# Patient Record
Sex: Male | Born: 1960 | Race: White | Hispanic: No | Marital: Married | Smoking: Never smoker
Health system: Southern US, Community
[De-identification: ages and names within clinical notes are randomized; demographics above are authoritative.]

## PROBLEM LIST (undated history)

## (undated) DIAGNOSIS — I82409 Acute embolism and thrombosis of unspecified deep veins of unspecified lower extremity: Secondary | ICD-10-CM

## (undated) DIAGNOSIS — E119 Type 2 diabetes mellitus without complications: Secondary | ICD-10-CM

## (undated) DIAGNOSIS — R002 Palpitations: Secondary | ICD-10-CM

## (undated) DIAGNOSIS — J45909 Unspecified asthma, uncomplicated: Secondary | ICD-10-CM

## (undated) DIAGNOSIS — G43909 Migraine, unspecified, not intractable, without status migrainosus: Secondary | ICD-10-CM

## (undated) DIAGNOSIS — E7800 Pure hypercholesterolemia, unspecified: Secondary | ICD-10-CM

## (undated) DIAGNOSIS — I639 Cerebral infarction, unspecified: Secondary | ICD-10-CM

## (undated) DIAGNOSIS — I491 Atrial premature depolarization: Secondary | ICD-10-CM

## (undated) DIAGNOSIS — I152 Hypertension secondary to endocrine disorders: Secondary | ICD-10-CM

## (undated) DIAGNOSIS — I428 Other cardiomyopathies: Secondary | ICD-10-CM

## (undated) DIAGNOSIS — N200 Calculus of kidney: Secondary | ICD-10-CM

## (undated) DIAGNOSIS — I6529 Occlusion and stenosis of unspecified carotid artery: Secondary | ICD-10-CM

## (undated) DIAGNOSIS — E872 Acidosis: Secondary | ICD-10-CM

## (undated) DIAGNOSIS — K529 Noninfective gastroenteritis and colitis, unspecified: Secondary | ICD-10-CM

## (undated) DIAGNOSIS — I480 Paroxysmal atrial fibrillation: Secondary | ICD-10-CM

## (undated) DIAGNOSIS — R197 Diarrhea, unspecified: Secondary | ICD-10-CM

## (undated) DIAGNOSIS — E1159 Type 2 diabetes mellitus with other circulatory complications: Secondary | ICD-10-CM

## (undated) DIAGNOSIS — A419 Sepsis, unspecified organism: Secondary | ICD-10-CM

## (undated) DIAGNOSIS — R6520 Severe sepsis without septic shock: Secondary | ICD-10-CM

## (undated) HISTORY — DX: Diarrhea, unspecified: R19.7

## (undated) HISTORY — DX: Cerebral infarction, unspecified: I63.9

## (undated) HISTORY — DX: Acidosis: E87.2

## (undated) HISTORY — DX: Migraine, unspecified, not intractable, without status migrainosus: G43.909

## (undated) HISTORY — DX: Acute embolism and thrombosis of unspecified deep veins of unspecified lower extremity: I82.409

## (undated) HISTORY — DX: Sepsis, unspecified organism: A41.9

## (undated) HISTORY — DX: Type 2 diabetes mellitus without complications: E11.9

## (undated) HISTORY — DX: Atrial premature depolarization: I49.1

## (undated) HISTORY — PX: CHOLECYSTECTOMY: SHX55

## (undated) HISTORY — DX: Palpitations: R00.2

## (undated) HISTORY — DX: Severe sepsis without septic shock: R65.20

## (undated) HISTORY — DX: Unspecified asthma, uncomplicated: J45.909

## (undated) HISTORY — DX: Noninfective gastroenteritis and colitis, unspecified: K52.9

## (undated) HISTORY — DX: Occlusion and stenosis of unspecified carotid artery: I65.29

## (undated) HISTORY — PX: COLON RESECTION: SHX5231

## (undated) HISTORY — DX: Calculus of kidney: N20.0

## (undated) HISTORY — DX: Pure hypercholesterolemia, unspecified: E78.00

---
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
## 2009-08-15 HISTORY — PX: IVC FILTER INSERTION: CATH118245

## 2014-03-25 DIAGNOSIS — I639 Cerebral infarction, unspecified: Secondary | ICD-10-CM

## 2014-03-25 DIAGNOSIS — G43909 Migraine, unspecified, not intractable, without status migrainosus: Secondary | ICD-10-CM

## 2014-03-25 HISTORY — DX: Cerebral infarction, unspecified: I63.9

## 2014-03-25 HISTORY — DX: Migraine, unspecified, not intractable, without status migrainosus: G43.909

## 2015-02-12 DIAGNOSIS — E872 Acidosis: Secondary | ICD-10-CM | POA: Insufficient documentation

## 2015-02-12 DIAGNOSIS — E119 Type 2 diabetes mellitus without complications: Secondary | ICD-10-CM

## 2015-02-12 DIAGNOSIS — A419 Sepsis, unspecified organism: Secondary | ICD-10-CM

## 2015-02-12 DIAGNOSIS — K529 Noninfective gastroenteritis and colitis, unspecified: Secondary | ICD-10-CM

## 2015-02-12 DIAGNOSIS — E8729 Other acidosis: Secondary | ICD-10-CM

## 2015-02-12 DIAGNOSIS — R6520 Severe sepsis without septic shock: Secondary | ICD-10-CM

## 2015-02-12 HISTORY — DX: Other acidosis: E87.29

## 2015-02-12 HISTORY — DX: Type 2 diabetes mellitus without complications: E11.9

## 2015-02-12 HISTORY — DX: Noninfective gastroenteritis and colitis, unspecified: K52.9

## 2015-02-12 HISTORY — DX: Sepsis, unspecified organism: R65.20

## 2015-02-12 HISTORY — DX: Sepsis, unspecified organism: A41.9

## 2015-02-12 HISTORY — DX: Acidosis: E87.2

## 2015-10-08 DIAGNOSIS — K529 Noninfective gastroenteritis and colitis, unspecified: Secondary | ICD-10-CM

## 2015-10-08 DIAGNOSIS — I6529 Occlusion and stenosis of unspecified carotid artery: Secondary | ICD-10-CM

## 2015-10-08 DIAGNOSIS — R197 Diarrhea, unspecified: Secondary | ICD-10-CM

## 2015-10-08 HISTORY — DX: Diarrhea, unspecified: R19.7

## 2015-10-08 HISTORY — DX: Noninfective gastroenteritis and colitis, unspecified: K52.9

## 2015-10-08 HISTORY — DX: Occlusion and stenosis of unspecified carotid artery: I65.29

## 2015-12-11 DIAGNOSIS — E538 Deficiency of other specified B group vitamins: Secondary | ICD-10-CM | POA: Diagnosis not present

## 2015-12-11 DIAGNOSIS — E785 Hyperlipidemia, unspecified: Secondary | ICD-10-CM | POA: Diagnosis not present

## 2015-12-11 DIAGNOSIS — E119 Type 2 diabetes mellitus without complications: Secondary | ICD-10-CM | POA: Diagnosis not present

## 2015-12-11 DIAGNOSIS — E559 Vitamin D deficiency, unspecified: Secondary | ICD-10-CM | POA: Diagnosis not present

## 2015-12-11 DIAGNOSIS — Z6823 Body mass index (BMI) 23.0-23.9, adult: Secondary | ICD-10-CM | POA: Diagnosis not present

## 2015-12-11 DIAGNOSIS — M25519 Pain in unspecified shoulder: Secondary | ICD-10-CM | POA: Diagnosis not present

## 2016-03-11 DIAGNOSIS — E785 Hyperlipidemia, unspecified: Secondary | ICD-10-CM | POA: Diagnosis not present

## 2016-03-11 DIAGNOSIS — E119 Type 2 diabetes mellitus without complications: Secondary | ICD-10-CM | POA: Diagnosis not present

## 2016-03-11 DIAGNOSIS — E538 Deficiency of other specified B group vitamins: Secondary | ICD-10-CM | POA: Diagnosis not present

## 2016-03-11 DIAGNOSIS — E559 Vitamin D deficiency, unspecified: Secondary | ICD-10-CM | POA: Diagnosis not present

## 2016-04-20 DIAGNOSIS — J441 Chronic obstructive pulmonary disease with (acute) exacerbation: Secondary | ICD-10-CM | POA: Diagnosis not present

## 2016-06-13 DIAGNOSIS — M25519 Pain in unspecified shoulder: Secondary | ICD-10-CM | POA: Diagnosis not present

## 2016-06-13 DIAGNOSIS — E663 Overweight: Secondary | ICD-10-CM | POA: Diagnosis not present

## 2016-06-13 DIAGNOSIS — Z6823 Body mass index (BMI) 23.0-23.9, adult: Secondary | ICD-10-CM | POA: Diagnosis not present

## 2016-06-13 DIAGNOSIS — E538 Deficiency of other specified B group vitamins: Secondary | ICD-10-CM | POA: Diagnosis not present

## 2016-06-13 DIAGNOSIS — E119 Type 2 diabetes mellitus without complications: Secondary | ICD-10-CM | POA: Diagnosis not present

## 2016-06-13 DIAGNOSIS — E559 Vitamin D deficiency, unspecified: Secondary | ICD-10-CM | POA: Diagnosis not present

## 2016-06-13 DIAGNOSIS — Z125 Encounter for screening for malignant neoplasm of prostate: Secondary | ICD-10-CM | POA: Diagnosis not present

## 2016-06-13 DIAGNOSIS — E785 Hyperlipidemia, unspecified: Secondary | ICD-10-CM | POA: Diagnosis not present

## 2016-06-13 DIAGNOSIS — Z0000 Encounter for general adult medical examination without abnormal findings: Secondary | ICD-10-CM | POA: Diagnosis not present

## 2016-09-14 DIAGNOSIS — Z6823 Body mass index (BMI) 23.0-23.9, adult: Secondary | ICD-10-CM | POA: Diagnosis not present

## 2016-09-14 DIAGNOSIS — E119 Type 2 diabetes mellitus without complications: Secondary | ICD-10-CM | POA: Diagnosis not present

## 2016-09-14 DIAGNOSIS — E559 Vitamin D deficiency, unspecified: Secondary | ICD-10-CM | POA: Diagnosis not present

## 2016-09-14 DIAGNOSIS — E785 Hyperlipidemia, unspecified: Secondary | ICD-10-CM | POA: Diagnosis not present

## 2016-11-15 DIAGNOSIS — H52223 Regular astigmatism, bilateral: Secondary | ICD-10-CM | POA: Diagnosis not present

## 2016-11-15 DIAGNOSIS — H524 Presbyopia: Secondary | ICD-10-CM | POA: Diagnosis not present

## 2016-12-13 DIAGNOSIS — E119 Type 2 diabetes mellitus without complications: Secondary | ICD-10-CM | POA: Diagnosis not present

## 2016-12-13 DIAGNOSIS — G459 Transient cerebral ischemic attack, unspecified: Secondary | ICD-10-CM | POA: Diagnosis not present

## 2016-12-13 DIAGNOSIS — Z23 Encounter for immunization: Secondary | ICD-10-CM | POA: Diagnosis not present

## 2016-12-13 DIAGNOSIS — E785 Hyperlipidemia, unspecified: Secondary | ICD-10-CM | POA: Diagnosis not present

## 2016-12-13 DIAGNOSIS — M25519 Pain in unspecified shoulder: Secondary | ICD-10-CM | POA: Diagnosis not present

## 2017-03-15 DIAGNOSIS — E785 Hyperlipidemia, unspecified: Secondary | ICD-10-CM | POA: Diagnosis not present

## 2017-03-15 DIAGNOSIS — E1140 Type 2 diabetes mellitus with diabetic neuropathy, unspecified: Secondary | ICD-10-CM | POA: Diagnosis not present

## 2017-03-15 DIAGNOSIS — K529 Noninfective gastroenteritis and colitis, unspecified: Secondary | ICD-10-CM | POA: Diagnosis not present

## 2017-03-15 DIAGNOSIS — E119 Type 2 diabetes mellitus without complications: Secondary | ICD-10-CM | POA: Diagnosis not present

## 2017-03-15 DIAGNOSIS — E559 Vitamin D deficiency, unspecified: Secondary | ICD-10-CM | POA: Diagnosis not present

## 2017-03-15 DIAGNOSIS — M25519 Pain in unspecified shoulder: Secondary | ICD-10-CM | POA: Diagnosis not present

## 2017-06-16 DIAGNOSIS — M25519 Pain in unspecified shoulder: Secondary | ICD-10-CM | POA: Diagnosis not present

## 2017-06-16 DIAGNOSIS — E538 Deficiency of other specified B group vitamins: Secondary | ICD-10-CM | POA: Diagnosis not present

## 2017-06-16 DIAGNOSIS — Z23 Encounter for immunization: Secondary | ICD-10-CM | POA: Diagnosis not present

## 2017-06-16 DIAGNOSIS — Z0000 Encounter for general adult medical examination without abnormal findings: Secondary | ICD-10-CM | POA: Diagnosis not present

## 2017-06-16 DIAGNOSIS — Z6822 Body mass index (BMI) 22.0-22.9, adult: Secondary | ICD-10-CM | POA: Diagnosis not present

## 2017-06-18 DIAGNOSIS — R200 Anesthesia of skin: Secondary | ICD-10-CM | POA: Diagnosis not present

## 2017-06-18 DIAGNOSIS — E119 Type 2 diabetes mellitus without complications: Secondary | ICD-10-CM | POA: Diagnosis not present

## 2017-06-18 DIAGNOSIS — I10 Essential (primary) hypertension: Secondary | ICD-10-CM | POA: Diagnosis not present

## 2017-06-18 DIAGNOSIS — R079 Chest pain, unspecified: Secondary | ICD-10-CM | POA: Diagnosis not present

## 2017-06-18 DIAGNOSIS — Z79899 Other long term (current) drug therapy: Secondary | ICD-10-CM | POA: Diagnosis not present

## 2017-06-18 DIAGNOSIS — Z8249 Family history of ischemic heart disease and other diseases of the circulatory system: Secondary | ICD-10-CM | POA: Diagnosis not present

## 2017-06-18 DIAGNOSIS — Z7982 Long term (current) use of aspirin: Secondary | ICD-10-CM | POA: Diagnosis not present

## 2017-06-18 DIAGNOSIS — R0789 Other chest pain: Secondary | ICD-10-CM | POA: Diagnosis not present

## 2017-06-18 DIAGNOSIS — E7800 Pure hypercholesterolemia, unspecified: Secondary | ICD-10-CM | POA: Diagnosis not present

## 2017-06-18 DIAGNOSIS — J45909 Unspecified asthma, uncomplicated: Secondary | ICD-10-CM | POA: Diagnosis not present

## 2017-06-18 DIAGNOSIS — Z8673 Personal history of transient ischemic attack (TIA), and cerebral infarction without residual deficits: Secondary | ICD-10-CM | POA: Diagnosis not present

## 2017-06-18 DIAGNOSIS — Z794 Long term (current) use of insulin: Secondary | ICD-10-CM | POA: Diagnosis not present

## 2017-06-19 DIAGNOSIS — R079 Chest pain, unspecified: Secondary | ICD-10-CM | POA: Diagnosis not present

## 2017-06-19 DIAGNOSIS — E119 Type 2 diabetes mellitus without complications: Secondary | ICD-10-CM | POA: Diagnosis not present

## 2017-06-19 DIAGNOSIS — I10 Essential (primary) hypertension: Secondary | ICD-10-CM | POA: Diagnosis not present

## 2017-06-20 DIAGNOSIS — E7800 Pure hypercholesterolemia, unspecified: Secondary | ICD-10-CM | POA: Insufficient documentation

## 2017-06-20 DIAGNOSIS — N200 Calculus of kidney: Secondary | ICD-10-CM

## 2017-06-20 DIAGNOSIS — J45909 Unspecified asthma, uncomplicated: Secondary | ICD-10-CM

## 2017-06-20 HISTORY — DX: Calculus of kidney: N20.0

## 2017-06-20 HISTORY — DX: Unspecified asthma, uncomplicated: J45.909

## 2017-06-20 HISTORY — DX: Pure hypercholesterolemia, unspecified: E78.00

## 2017-06-21 ENCOUNTER — Other Ambulatory Visit: Payer: Self-pay | Admitting: *Deleted

## 2017-06-21 DIAGNOSIS — Z6822 Body mass index (BMI) 22.0-22.9, adult: Secondary | ICD-10-CM | POA: Diagnosis not present

## 2017-06-21 DIAGNOSIS — R079 Chest pain, unspecified: Secondary | ICD-10-CM | POA: Insufficient documentation

## 2017-06-21 DIAGNOSIS — Z79899 Other long term (current) drug therapy: Secondary | ICD-10-CM | POA: Diagnosis not present

## 2017-06-21 DIAGNOSIS — R9439 Abnormal result of other cardiovascular function study: Secondary | ICD-10-CM | POA: Diagnosis not present

## 2017-06-21 NOTE — Progress Notes (Signed)
Cardiology Office Note:    Date:  06/22/2017   ID:  Patrick Curry, DOB 1961/04/06, MRN [id number]  PCP:  Guadalupe Maple., MD  Cardiologist:  Norman Herrlich, MD   Referring MD: Guadalupe Maple., MD  ASSESSMENT:    1. Chest pain in adult   2. Abnormal myocardial perfusion study   3. Hypercholesterolemia   4. CAD in native artery    PLAN:    In order of problems listed above:  1. His chest pain symptoms are atypical but anginal in nature relieved with nitroglycerin.  His myocardial perfusion study is abnormal consistent with previous infarction done pharmacologically showed no ischemia.  He has had no recurrent chest pain I asked him to initiate his beta-blocker continue aspirin statin and nitroglycerin as needed. 2. His myocardial perfusion study is abnormal.  He has objective evidence of CAD I am concerned that he has balanced ischemia and I asked him to undergo cardiac CTA. 3. Stable continue statin 4. See discussion on normal number #1 and #2 we will continue treatment with aspirin taking a higher dose at the direction of her neurologist along with a beta-blocker and statin.  If he has evidence of high risk markers and cardiac CTA will need to consider revascularization in a diabetic.  Next appointment   Medication Adjustments/Labs and Tests Ordered: Current medicines are reviewed at length with the patient today.  Concerns regarding medicines are outlined above.  Orders Placed This Encounter  Procedures  . CT CORONARY MORPH W/CTA COR W/SCORE W/CA W/CM &/OR WO/CM  . CT CORONARY FRACTIONAL FLOW RESERVE DATA PREP  . CT CORONARY FRACTIONAL FLOW RESERVE FLUID ANALYSIS   Meds ordered this encounter  Medications  . carvedilol (COREG) 6.25 MG tablet    Sig: Take 1 tablet (6.25 mg total) 2 (two) times daily with a meal by mouth.    Dispense:  60 tablet    Refill:  3     Chief Complaint  Patient presents with  . Coronary Artery Disease  4 weeks  History of Present Illness:    Patrick Curry is a 56 y.o. male  DM hyperlipidemia and stroke with a recent [facility] ED visit and brief admission with chest pain who is being seen today for the evaluation of chest pain and an abnormal MPI at the request of Guadalupe Maple., MD. He was recently admitted to [facility] with chest pain.  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  He was given 3 nitroglycerin with relief.  His chest pain is unusual he describes it as pins and needles throughout the left precordium no radiation no shortness of breath but he felt very weak and lightheaded and unfortunately did not check to see if he was hypoglycemic.  He has had no recurrent chest pain but the day of his myocardial perfusion study was nauseous and unable to use a treadmill was done pharmacologically.  He was prescribed a beta-blocker that he has yet to initiate.  He has a history of stroke as well as colitis that could be ischemic in nature.  Past Medical History:  Diagnosis Date  . Asthma 06/20/2017  . Carotid stenosis 10/08/2015  . Diarrhea 10/08/2015  . DVT (deep venous thrombosis) (HCC)   . Hemorrhagic colitis 02/12/2015  . Hypercholesterolemia 06/20/2017  . IBD (inflammatory bowel disease) 10/08/2015  . Ketoacidosis 02/12/2015  .  Kidney stones 06/20/2017  . Migraine 03/25/2014  . Severe sepsis (HCC) 02/12/2015  . Stroke (HCC) 03/25/2014  . Type 2 diabetes mellitus without complication (HCC) 02/12/2015    Past Surgical History:  Procedure Laterality Date  . CHOLECYSTECTOMY    . COLON RESECTION     Partial, after trauma  . IVC FILTER INSERTION  2011    Current Medications: Current Meds  Medication Sig  . amitriptyline (ELAVIL) 25 MG tablet Take 25 mg at bedtime by mouth.  Marland Kitchen. aspirin 325 MG tablet Take 325 mg daily by mouth.  . carvedilol (COREG) 6.25 MG tablet Take 1 tablet (6.25 mg total) 2  (two) times daily with a meal by mouth.  . cyanocobalamin 1000 MCG tablet Take 1,000 mcg daily by mouth.  . folic acid (FOLVITE) 1 MG tablet Take 1 mg by mouth.  . gabapentin (NEURONTIN) 800 MG tablet TAKE 1 TABLET FOUR TIMES DAILY AS NEEDED.  Marland Kitchen. HYDROcodone-acetaminophen (NORCO) 10-325 MG tablet Take by mouth.  . insulin lispro (HUMALOG) 100 UNIT/ML injection Inject into the skin.  . Lactobacillus ([employer] PROBIOTIC ACIDOPHILUS) 10 MG CAPS Take by mouth.  Marland Kitchen. LEVEMIR FLEXTOUCH 100 UNIT/ML Pen INJECT 80 UNITS SUBCUTANEOUSLY DAILY  . loperamide (IMODIUM) 1 MG/5ML solution Take as needed by mouth for diarrhea or loose stools.  . metFORMIN (GLUCOPHAGE) 1000 MG tablet Take 1,000 mg by mouth.  . montelukast (SINGULAIR) 10 MG tablet   . nitroGLYCERIN (NITROSTAT) 0.4 MG SL tablet   . pravastatin (PRAVACHOL) 20 MG tablet Take 20 mg by mouth.  . sildenafil (REVATIO) 20 MG tablet TAKE 2-5 TABLETS BY MOUTH AS NEEDED.  Marland Kitchen. sitaGLIPtin (JANUVIA) 100 MG tablet Take 100 mg by mouth.  . sulfaSALAzine (AZULFIDINE) 500 MG tablet TAKE THREE TABLETS BY MOUTH TWICE DAILY  . SURE COMFORT PEN NEEDLES 31G X 8 MM MISC See admin instructions.  . Vitamin D, Ergocalciferol, (DRISDOL) 50000 units CAPS capsule TAKE 1 CAPSULE ONCE WEEKLY.  . [DISCONTINUED] carvedilol (COREG) 3.125 MG tablet      Allergies:   Patient has no known allergies.   Social History   Socioeconomic History  . Marital status: Married    Spouse name: None  . Number of children: None  . Years of education: None  . Highest education level: None  Social Needs  . Financial resource strain: None  . Food insecurity - worry: None  . Food insecurity - inability: None  . Transportation needs - medical: None  . Transportation needs - non-medical: None  Occupational History  . None  Tobacco Use  . Smoking status: Never Smoker  . Smokeless tobacco: Never Used  Substance and Sexual Activity  . Alcohol use: No    Frequency: Never  . Drug use: No  .  Sexual activity: None  Other Topics Concern  . None  Social History Narrative  . None     Family History: The patient's family history includes Diabetes in his father and mother; Heart disease in his mother; Hypertension in his mother.  ROS:   Review of Systems  Constitution: Negative.  HENT: Negative.   Eyes: Negative.   Cardiovascular: Positive for chest pain.  Respiratory: Negative.   Endocrine: Negative.   Hematologic/Lymphatic: Negative.   Skin: Negative.   Musculoskeletal: Negative.   Gastrointestinal: Negative.   Genitourinary: Negative.   Neurological: Positive for light-headedness.  Psychiatric/Behavioral: Negative.   Allergic/Immunologic: Negative.    Please see the history of present illness.      EKGs/Labs/Other Studies Reviewed:  The following studies were reviewed today: He has had no recurrent chest pain I did not repeat an EKG today [facility] records including ED notes admission discharge labs EKG and MPI reviewed prior to visit MPI 06/19/17 Fixed defect moderate mid septum with hypokinesia, EF 62% Recent Labs: CBC, CMP were nromal, D Dimer 203, serial Troponin < 0.01, BNP 62, EKG and CXR in ED normal   Physical Exam:    VS:  BP (!) 142/78 (BP Location: Left Arm, Patient Position: Sitting, Cuff Size: Normal)   Pulse 90   Ht 6' (1.829 m)   Wt 170 lb (77.1 kg)   SpO2 96%   BMI 23.06 kg/m     Wt Readings from Last 3 Encounters:  06/22/17 170 lb (77.1 kg)     GEN: He does not appear debilitated a week and has no obvious residual from his previous stroke treated with TPA well nourished, well developed in no acute distress HEENT: Normal NECK: No JVD; No carotid bruits LYMPHATICS: No lymphadenopathy CARDIAC: RRR, no murmurs, rubs, gallops RESPIRATORY:  Clear to auscultation without rales, wheezing or rhonchi  ABDOMEN: Soft, non-tender, non-distended MUSCULOSKELETAL:  No edema; No deformity  SKIN: Warm and dry NEUROLOGIC:  Alert and  oriented x 3 PSYCHIATRIC:  Normal affect     Signed, Norman Herrlich, MD  06/22/2017 4:58 PM    [facility]

## 2017-06-22 ENCOUNTER — Ambulatory Visit (INDEPENDENT_AMBULATORY_CARE_PROVIDER_SITE_OTHER): Payer: BLUE CROSS/BLUE SHIELD | Admitting: Cardiology

## 2017-06-22 ENCOUNTER — Encounter: Payer: Self-pay | Admitting: Cardiology

## 2017-06-22 VITALS — BP 142/78 | HR 90 | Ht 72.0 in | Wt 170.0 lb

## 2017-06-22 DIAGNOSIS — I2510 Atherosclerotic heart disease of native coronary artery without angina pectoris: Secondary | ICD-10-CM | POA: Diagnosis not present

## 2017-06-22 DIAGNOSIS — R079 Chest pain, unspecified: Secondary | ICD-10-CM | POA: Diagnosis not present

## 2017-06-22 DIAGNOSIS — R9439 Abnormal result of other cardiovascular function study: Secondary | ICD-10-CM | POA: Diagnosis not present

## 2017-06-22 DIAGNOSIS — E7800 Pure hypercholesterolemia, unspecified: Secondary | ICD-10-CM | POA: Diagnosis not present

## 2017-06-22 MED ORDER — CARVEDILOL 6.25 MG PO TABS: 6.2500 mg | ORAL_TABLET | Freq: Two times a day (BID) | ORAL | 3 refills | Status: DC

## 2017-06-22 NOTE — Patient Instructions (Addendum)
Medication Instructions:  Your physician has recommended you make the following change in your medication:  START carvedilol (Coreg) 6.25 mg twice daily  Labwork: None  Testing/Procedures: You will be scheduled for a cardiac CT.  Follow-Up: Your physician recommends that you schedule a follow-up appointment in: 4 weeks.  Any Other Special Instructions Will Be Listed Below (If Applicable).     If you need a refill on your cardiac medications before your next appointment, please call your pharmacy.  Please arrive at the [facility] main entrance of [facility] at xx:xx AM (30-45 minutes prior to test start time)  [facility] [address] [place], [place] [address] [phone]  Proceed to the [facility] Radiology Department (First Floor).  Please follow these instructions carefully (unless otherwise directed):  Hold all erectile dysfunction medications at least 48 hours prior to test.  On the Night Before the Test: . Drink plenty of water. . Do not consume any caffeinated/decaffeinated beverages or chocolate 12 hours prior to your test. . Do not take any antihistamines 12 hours prior to your test. . If you take Metformin do not take 24 hours prior to test.  On the Day of the Test: . Drink plenty of water. Do not drink any water within one hour of the test. . Do not eat any food 4 hours prior to the test. . You may take your regular medications prior to the test.  After the Test: . Drink plenty of water. . After receiving IV contrast, you may experience a mild flushed feeling. This is normal. . On occasion, you may experience a mild rash up to 24 hours after the test. This is not dangerous. If this occurs, you can take Benadryl 25 mg and increase your fluid intake. . If you experience trouble breathing, this can be serious. If it is severe call 911 IMMEDIATELY. If it is mild, please call our office. . If you take: Glipizide/Metformin, please do  not take 48 hours after completing test.    DASH Eating Plan DASH stands for "Dietary Approaches to Stop Hypertension." The DASH eating plan is a healthy eating plan that has been shown to reduce high blood pressure (hypertension). It may also reduce your risk for type 2 diabetes, heart disease, and stroke. The DASH eating plan may also help with weight loss. What are tips for following this plan? General guidelines  Avoid eating more than 2,300 mg (milligrams) of salt (sodium) a day. If you have hypertension, you may need to reduce your sodium intake to 1,500 mg a day.  Limit alcohol intake to no more than 1 drink a day for nonpregnant women and 2 drinks a day for men. One drink equals 12 oz of beer, 5 oz of wine, or 1 oz of hard liquor.  Work with your health care provider to maintain a healthy body weight or to lose weight. Ask what an ideal weight is for you.  Get at least 30 minutes of exercise that causes your heart to beat faster (aerobic exercise) most days of the week. Activities may include walking, swimming, or biking.  Work with your health care provider or diet and nutrition specialist (dietitian) to adjust your eating plan to your individual calorie needs. Reading food labels  Check food labels for the amount of sodium per serving. Choose foods with less than 5 percent of the Daily Value of sodium. Generally, foods with less than 300 mg of sodium per serving fit into this eating  plan.  To find whole grains, look for the word "whole" as the first word in the ingredient list. Shopping  Buy products labeled as "low-sodium" or "no salt added."  Buy fresh foods. Avoid canned foods and premade or frozen meals. Cooking  Avoid adding salt when cooking. Use salt-free seasonings or herbs instead of table salt or sea salt. Check with your health care provider or pharmacist before using salt substitutes.  Do not fry foods. Cook foods using healthy methods such as baking, boiling,  grilling, and broiling instead.  Cook with heart-healthy oils, such as olive, canola, soybean, or sunflower oil. Meal planning   Eat a balanced diet that includes: ? 5 or more servings of fruits and vegetables each day. At each meal, try to fill half of your plate with fruits and vegetables. ? Up to 6-8 servings of whole grains each day. ? Less than 6 oz of lean meat, poultry, or fish each day. A 3-oz serving of meat is about the same size as a deck of cards. One egg equals 1 oz. ? 2 servings of low-fat dairy each day. ? A serving of nuts, seeds, or beans 5 times each week. ? Heart-healthy fats. Healthy fats called Omega-3 fatty acids are found in foods such as flaxseeds and coldwater fish, like sardines, salmon, and mackerel.  Limit how much you eat of the following: ? Canned or prepackaged foods. ? Food that is high in trans fat, such as fried foods. ? Food that is high in saturated fat, such as fatty meat. ? Sweets, desserts, sugary drinks, and other foods with added sugar. ? Full-fat dairy products.  Do not salt foods before eating.  Try to eat at least 2 vegetarian meals each week.  Eat more home-cooked food and less restaurant, buffet, and fast food.  When eating at a restaurant, ask that your food be prepared with less salt or no salt, if possible. What foods are recommended? The items listed may not be a complete list. Talk with your dietitian about what dietary choices are best for you. Grains Whole-grain or whole-wheat bread. Whole-grain or whole-wheat pasta. Brown rice. Orpah Cobbatmeal. Quinoa. Bulgur. Whole-grain and low-sodium cereals. Pita bread. Low-fat, low-sodium crackers. Whole-wheat flour tortillas. Vegetables Fresh or frozen vegetables (raw, steamed, roasted, or grilled). Low-sodium or reduced-sodium tomato and vegetable juice. Low-sodium or reduced-sodium tomato sauce and tomato paste. Low-sodium or reduced-sodium canned vegetables. Fruits All fresh, dried, or frozen  fruit. Canned fruit in natural juice (without added sugar). Meat and other protein foods Skinless chicken or [place]. Ground chicken or [place]. Pork with fat trimmed off. Fish and seafood. Egg whites. Dried beans, peas, or lentils. Unsalted nuts, nut butters, and seeds. Unsalted canned beans. Lean cuts of beef with fat trimmed off. Low-sodium, lean deli meat. Dairy Low-fat (1%) or fat-free (skim) milk. Fat-free, low-fat, or reduced-fat cheeses. Nonfat, low-sodium ricotta or cottage cheese. Low-fat or nonfat yogurt. Low-fat, low-sodium cheese. Fats and oils Soft margarine without trans fats. Vegetable oil. Low-fat, reduced-fat, or light mayonnaise and salad dressings (reduced-sodium). Canola, safflower, olive, soybean, and sunflower oils. Avocado. Seasoning and other foods Herbs. Spices. Seasoning mixes without salt. Unsalted popcorn and pretzels. Fat-free sweets. What foods are not recommended? The items listed may not be a complete list. Talk with your dietitian about what dietary choices are best for you. Grains Baked goods made with fat, such as croissants, muffins, or some breads. Dry pasta or rice meal packs. Vegetables Creamed or fried vegetables. Vegetables in a cheese sauce. Regular  canned vegetables (not low-sodium or reduced-sodium). Regular canned tomato sauce and paste (not low-sodium or reduced-sodium). Regular tomato and vegetable juice (not low-sodium or reduced-sodium). Rosita Fire. Olives. Fruits Canned fruit in a light or heavy syrup. Fried fruit. Fruit in cream or butter sauce. Meat and other protein foods Fatty cuts of meat. Ribs. Fried meat. Tomasa Blase. Sausage. Bologna and other processed lunch meats. Salami. Fatback. Hotdogs. Bratwurst. Salted nuts and seeds. Canned beans with added salt. Canned or smoked fish. Whole eggs or egg yolks. Chicken or [place] with skin. Dairy Whole or 2% milk, cream, and half-and-half. Whole or full-fat cream cheese. Whole-fat or sweetened yogurt. Full-fat  cheese. Nondairy creamers. Whipped toppings. Processed cheese and cheese spreads. Fats and oils Butter. Stick margarine. Lard. Shortening. Ghee. Bacon fat. Tropical oils, such as coconut, palm kernel, or palm oil. Seasoning and other foods Salted popcorn and pretzels. Onion salt, garlic salt, seasoned salt, table salt, and sea salt. Worcestershire sauce. Tartar sauce. Barbecue sauce. Teriyaki sauce. Soy sauce, including reduced-sodium. Steak sauce. Canned and packaged gravies. Fish sauce. Oyster sauce. Cocktail sauce. Horseradish that you find on the shelf. Ketchup. Mustard. Meat flavorings and tenderizers. Bouillon cubes. Hot sauce and Tabasco sauce. Premade or packaged marinades. Premade or packaged taco seasonings. Relishes. Regular salad dressings. Where to find more information:  National Heart, Lung, and Blood Institute: PopSteam.is  American Heart Association: www.heart.org Summary  The DASH eating plan is a healthy eating plan that has been shown to reduce high blood pressure (hypertension). It may also reduce your risk for type 2 diabetes, heart disease, and stroke.  With the DASH eating plan, you should limit salt (sodium) intake to 2,300 mg a day. If you have hypertension, you may need to reduce your sodium intake to 1,500 mg a day.  When on the DASH eating plan, aim to eat more fresh fruits and vegetables, whole grains, lean proteins, low-fat dairy, and heart-healthy fats.  Work with your health care provider or diet and nutrition specialist (dietitian) to adjust your eating plan to your individual calorie needs. This information is not intended to replace advice given to you by your health care provider. Make sure you discuss any questions you have with your health care provider. Document Released: 07/21/2011 Document Revised: 07/25/2016 Document Reviewed: 07/25/2016 Elsevier Interactive Patient Education  2017 [employer].

## 2017-06-30 DIAGNOSIS — E119 Type 2 diabetes mellitus without complications: Secondary | ICD-10-CM | POA: Diagnosis not present

## 2017-06-30 DIAGNOSIS — I10 Essential (primary) hypertension: Secondary | ICD-10-CM | POA: Diagnosis not present

## 2017-06-30 DIAGNOSIS — R079 Chest pain, unspecified: Secondary | ICD-10-CM | POA: Diagnosis not present

## 2017-07-06 DIAGNOSIS — R51 Headache: Secondary | ICD-10-CM | POA: Diagnosis not present

## 2017-07-06 DIAGNOSIS — R42 Dizziness and giddiness: Secondary | ICD-10-CM | POA: Diagnosis not present

## 2017-07-11 ENCOUNTER — Telehealth: Payer: Self-pay | Admitting: Cardiology

## 2017-07-11 DIAGNOSIS — Z01810 Encounter for preprocedural cardiovascular examination: Secondary | ICD-10-CM

## 2017-07-11 NOTE — Telephone Encounter (Signed)
Returned call to patient. Advised patient that he did not need an appointment for lab work prior to cardiac CT. Patient may arrive anytime between 8-5 and we close from 12-1 for lunch. Patient verbalized understanding. Reinforced that metformin needs to be held 24 hours prior to exam and 48 hours after exam. Patient verbalized understanding. No further questions.

## 2017-07-11 NOTE — Telephone Encounter (Signed)
Please call patient regarding his CT scan on 07/19/2017 and he has questions regarding the meds and directions prior to appt. Please call patient .

## 2017-07-13 DIAGNOSIS — Z01810 Encounter for preprocedural cardiovascular examination: Secondary | ICD-10-CM | POA: Diagnosis not present

## 2017-07-14 LAB — BASIC METABOLIC PANEL
BUN/Creatinine Ratio: 22 — ABNORMAL HIGH (ref 9–20)
BUN: 20 mg/dL (ref 6–24)
CALCIUM: 9 mg/dL (ref 8.7–10.2)
CO2: 26 mmol/L (ref 20–29)
CREATININE: 0.91 mg/dL (ref 0.76–1.27)
Chloride: 102 mmol/L (ref 96–106)
GFR calc Af Amer: 110 mL/min/{1.73_m2} (ref >59)
GFR calc non Af Amer: 95 mL/min/{1.73_m2} (ref >59)
GLUCOSE: 250 mg/dL — AB (ref 65–99)
Potassium: 4.1 mmol/L (ref 3.5–5.2)
Sodium: 143 mmol/L (ref 134–144)

## 2017-07-18 NOTE — Progress Notes (Signed)
Cardiology Office Note:    Date:  07/20/2017   ID:  Patrick Coyeralph Heckler, DOB 1960/09/22, MRN [id number]  PCP:  Guadalupe MapleGage, John F., MD  Cardiologist:  Norman HerrlichBrian Munley, MD    Referring MD: Guadalupe MapleGage, John F., MD    ASSESSMENT:    1. Mild CAD    PLAN:    In order of problems listed above:  1. His CTA showed very mild nonobstructive CAD has had no recurrent chest pain.  I asked him to continue medical treatment aspirin dose per neurology beta-blocker and statin and nitroglycerin as needed.  I will leave follow-up as needed as [place] will be [place] [place] in [place] but certainly would like to see him in the next year or sooner if he is having frequent angina.   Next appointment: as needed   Medication Adjustments/Labs and Tests Ordered: Current medicines are reviewed at length with the patient today.  Concerns regarding medicines are outlined above.  No orders of the defined types were placed in this encounter.  No orders of the defined types were placed in this encounter.   Chief Complaint  Patient presents with  . Follow-up  . Chest Pain    History of Present Illness:    Patrick Curry is a 56 y.o. male with a hx of DM hyperlipidemia and stroke with [facility] with chest pain  who is being seen today for the evaluation of chest pain and an abnormal MPI  last seen 1 month ago with a cardiac CTA.Marland Kitchen. Compliance with diet, lifestyle and medications: Yes He has had no further chest pain shortness of breath palpitation or syncope Past Medical History:  Diagnosis Date  . Asthma 06/20/2017  . Carotid stenosis 10/08/2015  . Diarrhea 10/08/2015  . DVT (deep venous thrombosis) (HCC)   . Hemorrhagic colitis 02/12/2015  . Hypercholesterolemia 06/20/2017  . IBD (inflammatory bowel disease) 10/08/2015  . Ketoacidosis 02/12/2015  . Kidney stones 06/20/2017  . Migraine 03/25/2014  . Severe sepsis (HCC) 02/12/2015  . Stroke (HCC) 03/25/2014  . Type 2 diabetes mellitus  without complication (HCC) 02/12/2015    Past Surgical History:  Procedure Laterality Date  . CHOLECYSTECTOMY    . COLON RESECTION     Partial, after trauma  . IVC FILTER INSERTION  2011    Current Medications: Current Meds  Medication Sig  . amitriptyline (ELAVIL) 25 MG tablet Take 25 mg by mouth at bedtime as needed.   Marland Kitchen. aspirin 325 MG tablet Take 325 mg daily by mouth.  . carvedilol (COREG) 6.25 MG tablet Take 1 tablet (6.25 mg total) 2 (two) times daily with a meal by mouth.  . cyanocobalamin 1000 MCG tablet Take 1,000 mcg daily by mouth.  . folic acid (FOLVITE) 1 MG tablet Take 1 mg by mouth.  . gabapentin (NEURONTIN) 800 MG tablet TAKE 1 TABLET FOUR TIMES DAILY AS NEEDED.  Marland Kitchen. HYDROcodone-acetaminophen (NORCO) 10-325 MG tablet Take by mouth.  . Lactobacillus ([employer] PROBIOTIC ACIDOPHILUS) 10 MG CAPS Take by mouth.  Marland Kitchen. LEVEMIR FLEXTOUCH 100 UNIT/ML Pen INJECT 80 UNITS SUBCUTANEOUSLY DAILY  . metFORMIN (GLUCOPHAGE) 1000 MG tablet Take 1,000 mg by mouth.  . montelukast (SINGULAIR) 10 MG tablet   . nitroGLYCERIN (NITROSTAT) 0.4 MG SL tablet   . pravastatin (PRAVACHOL) 20 MG tablet Take 20 mg by mouth daily.   . sildenafil (REVATIO) 20 MG tablet TAKE 2-5 TABLETS BY MOUTH AS NEEDED.  Marland Kitchen. sitaGLIPtin (JANUVIA) 100 MG tablet Take 100 mg  by mouth.  . sulfaSALAzine (AZULFIDINE) 500 MG tablet TAKE THREE TABLETS BY MOUTH TWICE DAILY  . SURE COMFORT PEN NEEDLES 31G X 8 MM MISC See admin instructions.  . Vitamin D, Ergocalciferol, (DRISDOL) 50000 units CAPS capsule TAKE 1 CAPSULE ONCE WEEKLY.  . [DISCONTINUED] loperamide (IMODIUM) 1 MG/5ML solution Take as needed by mouth for diarrhea or loose stools.     Allergies:   Patient has no known allergies.   Social History   Socioeconomic History  . Marital status: Married    Spouse name: None  . Number of children: None  . Years of education: None  . Highest education level: None  Social Needs  . Financial resource strain: None  . Food  insecurity - worry: None  . Food insecurity - inability: None  . Transportation needs - medical: None  . Transportation needs - non-medical: None  Occupational History  . None  Tobacco Use  . Smoking status: Never Smoker  . Smokeless tobacco: Never Used  Substance and Sexual Activity  . Alcohol use: No    Frequency: Never  . Drug use: No  . Sexual activity: None  Other Topics Concern  . None  Social History Narrative  . None     Family History: The patient's family history includes Diabetes in his father and mother; Heart disease in his mother; Hypertension in his mother. ROS:   Please see the history of present illness.    All other systems reviewed and are negative.  EKGs/Labs/Other Studies Reviewed:    The following studies were reviewed today:  Cardiac CTA: IMPRESSION: 1. Coronary artery calcium score of 24 Agatston units, placing the patient in the 64th percentile for age and gender, this suggests intermediate risk for future coronary events. 2.  Mild nonobstructive disease noted in the proximal LAD  Recent Labs: 07/13/2017: BUN 20; Creatinine, Ser 0.91; Potassium 4.1; Sodium 143  Recent Lipid Panel No results found for: CHOL, TRIG, HDL, CHOLHDL, VLDL, LDLCALC, LDLDIRECT  Physical Exam:    VS:  BP 110/70 (BP Location: Right Arm, Patient Position: Sitting, Cuff Size: Normal)   Pulse 73   Ht 6' (1.829 m)   Wt 177 lb (80.3 kg)   SpO2 97%   BMI 24.01 kg/m     Wt Readings from Last 3 Encounters:  07/20/17 177 lb (80.3 kg)  06/22/17 170 lb (77.1 kg)     GEN:  Well nourished, well developed in no acute distress HEENT: Normal NECK: No JVD; No carotid bruits LYMPHATICS: No lymphadenopathy CARDIAC: RRR, no murmurs, rubs, gallops RESPIRATORY:  Clear to auscultation without rales, wheezing or rhonchi  ABDOMEN: Soft, non-tender, non-distended MUSCULOSKELETAL:  No edema; No deformity  SKIN: Warm and dry NEUROLOGIC:  Alert and oriented x 3 PSYCHIATRIC:  Normal  affect    Signed, Norman Herrlich, MD  07/20/2017 10:19 AM    [facility]

## 2017-07-19 ENCOUNTER — Ambulatory Visit (HOSPITAL_COMMUNITY)
Admission: RE | Admit: 2017-07-19 | Discharge: 2017-07-19 | Disposition: A | Discharge status: Home / Self Care | Payer: BLUE CROSS/BLUE SHIELD | Source: Ambulatory Visit | Attending: Cardiology | Admitting: Cardiology

## 2017-07-19 ENCOUNTER — Ambulatory Visit (HOSPITAL_COMMUNITY): Admission: RE | Admit: 2017-07-19 | Payer: BLUE CROSS/BLUE SHIELD | Source: Ambulatory Visit

## 2017-07-19 ENCOUNTER — Encounter (HOSPITAL_COMMUNITY): Payer: Self-pay

## 2017-07-19 DIAGNOSIS — R079 Chest pain, unspecified: Secondary | ICD-10-CM | POA: Diagnosis not present

## 2017-07-19 DIAGNOSIS — I2510 Atherosclerotic heart disease of native coronary artery without angina pectoris: Secondary | ICD-10-CM | POA: Diagnosis not present

## 2017-07-19 DIAGNOSIS — R9439 Abnormal result of other cardiovascular function study: Secondary | ICD-10-CM | POA: Insufficient documentation

## 2017-07-19 MED ORDER — METOPROLOL TARTRATE 5 MG/5ML IV SOLN
5.0000 mg | Freq: Once | INTRAVENOUS | Status: AC
  Administered 2017-07-19: 5 mg via INTRAVENOUS

## 2017-07-19 MED ORDER — NITROGLYCERIN 0.4 MG SL SUBL
SUBLINGUAL_TABLET | SUBLINGUAL | Status: AC
  Filled 2017-07-19: qty 1

## 2017-07-19 MED ORDER — METOPROLOL TARTRATE 5 MG/5ML IV SOLN
INTRAVENOUS | Status: AC
  Filled 2017-07-19: qty 5

## 2017-07-19 MED ORDER — IOPAMIDOL (ISOVUE-370) INJECTION 76%
INTRAVENOUS | Status: AC
  Administered 2017-07-19: 80 mL
  Filled 2017-07-19: qty 100

## 2017-07-19 MED ORDER — NITROGLYCERIN 0.4 MG SL SUBL
0.4000 mg | SUBLINGUAL_TABLET | Freq: Once | SUBLINGUAL | Status: AC
  Administered 2017-07-19: 0.4 mg via SUBLINGUAL

## 2017-07-19 NOTE — Progress Notes (Signed)
CT scan completed. Tolerated well. D/C home with wife. Awake and alert. In no distress 

## 2017-07-20 ENCOUNTER — Ambulatory Visit (INDEPENDENT_AMBULATORY_CARE_PROVIDER_SITE_OTHER): Payer: BLUE CROSS/BLUE SHIELD | Admitting: Cardiology

## 2017-07-20 ENCOUNTER — Encounter: Payer: Self-pay | Admitting: Cardiology

## 2017-07-20 VITALS — BP 110/70 | HR 73 | Ht 72.0 in | Wt 177.0 lb

## 2017-07-20 DIAGNOSIS — I2510 Atherosclerotic heart disease of native coronary artery without angina pectoris: Secondary | ICD-10-CM

## 2017-07-20 NOTE — Patient Instructions (Addendum)
Medication Instructions:  Your physician recommends that you continue on your current medications as directed. Please refer to the Current Medication list given to you today.  Labwork: None  Testing/Procedures: None  Follow-Up: Your physician recommends that you schedule a follow-up appointment as needed if symptoms worsen or fail to improve.  Any Other Special Instructions Will Be Listed Below (If Applicable).     If you need a refill on your cardiac medications before your next appointment, please call your pharmacy.    Angina Pectoris Angina pectoris is a very bad feeling in the chest, neck, or arm. Your doctor may call it angina. There are four types of angina. Angina is caused by a lack of blood in the middle and thickest layer of the heart wall (myocardium). Angina may feel like a crushing or squeezing pain in the chest. It may feel like tightness or heavy pressure in the chest. Some people say it feels like gas, heartburn, or indigestion. Some people have symptoms other than pain. These include:  Shortness of breath.  Cold sweats.  Feeling sick to your stomach (nausea).  Feeling light-headed.  Many women have chest discomfort and some of the other symptoms. However, women often have different symptoms, such as:  Feeling tired (fatigue).  Feeling nervous for no reason.  Feeling weak for no reason.  Dizziness or fainting.  Women may have angina without any symptoms. Follow these instructions at home:  Take medicines only as told by your doctor.  Take care of other health issues as told by your doctor. These include: ? High blood pressure (hypertension). ? Diabetes.  Follow a heart-healthy diet. Your doctor can help you to choose healthy food options and make changes.  Talk to your doctor to learn more about healthy cooking methods and use them. These include: ? Roasting. ? Grilling. ? Broiling. ? Baking. ? Poaching. ? Steaming. ? Stir-frying.  Follow  an exercise program approved by your doctor.  Keep a healthy weight. Lose weight as told by your doctor.  Rest when you are tired.  Learn to manage stress.  Do not use any tobacco, such as cigarettes, chewing tobacco, or electronic cigarettes. If you need help quitting, ask your doctor.  If you drink alcohol, and your doctor says it is okay, limit yourself to no more than 1 drink per day. One drink equals 12 ounces of beer, 5 ounces of wine, or 1 ounces of hard liquor.  Stop illegal drug use.  Keep all follow-up visits as told by your doctor. This is important. Do not take these medicines unless your doctor says that you can:  Nonsteroidal anti-inflammatory drugs (NSAIDs). These include: ? Ibuprofen. ? Naproxen. ? Celecoxib.  Vitamin supplements that have vitamin A, vitamin E, or both.  Hormone therapy that contains estrogen with or without progestin.  Get help right away if:  You have pain in your chest, neck, arm, jaw, stomach, or back that: ? Lasts more than a few minutes. ? Comes back. ? Does not get better after you take medicine under your tongue (sublingual nitroglycerin).  You have any of these symptoms for no reason: ? Gas, heartburn, or indigestion. ? Sweating a lot. ? Shortness of breath or trouble breathing. ? Feeling sick to your stomach or throwing up. ? Feeling more tired than usual. ? Feeling nervous or worrying more than usual. ? Feeling weak. ? Diarrhea.  You are suddenly dizzy or light-headed.  You faint or pass out. These symptoms may be an emergency. Do  not wait to see if the symptoms will go away. Get medical help right away. Call your local emergency services (911 in the [place] Do not drive yourself to the hospital. This information is not intended to replace advice given to you by your health care provider. Make sure you discuss any questions you have with your health care provider. Document Released: 01/18/2008 Document Revised: 01/07/2016  Document Reviewed: 12/03/2013 Elsevier Interactive Patient Education  2017 [employer]

## 2017-09-18 DIAGNOSIS — E785 Hyperlipidemia, unspecified: Secondary | ICD-10-CM | POA: Diagnosis not present

## 2017-09-18 DIAGNOSIS — K210 Gastro-esophageal reflux disease with esophagitis: Secondary | ICD-10-CM | POA: Diagnosis not present

## 2017-09-18 DIAGNOSIS — E1140 Type 2 diabetes mellitus with diabetic neuropathy, unspecified: Secondary | ICD-10-CM | POA: Diagnosis not present

## 2017-09-18 DIAGNOSIS — G4700 Insomnia, unspecified: Secondary | ICD-10-CM | POA: Diagnosis not present

## 2017-09-18 DIAGNOSIS — E119 Type 2 diabetes mellitus without complications: Secondary | ICD-10-CM | POA: Diagnosis not present

## 2017-09-20 DIAGNOSIS — M65311 Trigger thumb, right thumb: Secondary | ICD-10-CM | POA: Diagnosis not present

## 2017-10-13 ENCOUNTER — Other Ambulatory Visit: Payer: Self-pay | Admitting: Cardiology

## 2018-01-02 DIAGNOSIS — E785 Hyperlipidemia, unspecified: Secondary | ICD-10-CM | POA: Diagnosis not present

## 2018-01-02 DIAGNOSIS — M25519 Pain in unspecified shoulder: Secondary | ICD-10-CM | POA: Diagnosis not present

## 2018-01-02 DIAGNOSIS — K529 Noninfective gastroenteritis and colitis, unspecified: Secondary | ICD-10-CM | POA: Diagnosis not present

## 2018-01-02 DIAGNOSIS — G4700 Insomnia, unspecified: Secondary | ICD-10-CM | POA: Diagnosis not present

## 2018-01-02 DIAGNOSIS — E118 Type 2 diabetes mellitus with unspecified complications: Secondary | ICD-10-CM | POA: Diagnosis not present

## 2018-02-05 DIAGNOSIS — R5381 Other malaise: Secondary | ICD-10-CM | POA: Diagnosis not present

## 2018-02-05 DIAGNOSIS — R110 Nausea: Secondary | ICD-10-CM | POA: Diagnosis not present

## 2018-02-05 DIAGNOSIS — E538 Deficiency of other specified B group vitamins: Secondary | ICD-10-CM | POA: Diagnosis not present

## 2018-02-05 DIAGNOSIS — E559 Vitamin D deficiency, unspecified: Secondary | ICD-10-CM | POA: Diagnosis not present

## 2018-02-28 DIAGNOSIS — H6092 Unspecified otitis externa, left ear: Secondary | ICD-10-CM | POA: Diagnosis not present

## 2018-02-28 DIAGNOSIS — R002 Palpitations: Secondary | ICD-10-CM | POA: Diagnosis not present

## 2018-02-28 DIAGNOSIS — Z6822 Body mass index (BMI) 22.0-22.9, adult: Secondary | ICD-10-CM | POA: Diagnosis not present

## 2018-04-05 DIAGNOSIS — M25519 Pain in unspecified shoulder: Secondary | ICD-10-CM | POA: Diagnosis not present

## 2018-04-05 DIAGNOSIS — K210 Gastro-esophageal reflux disease with esophagitis: Secondary | ICD-10-CM | POA: Diagnosis not present

## 2018-04-05 DIAGNOSIS — K529 Noninfective gastroenteritis and colitis, unspecified: Secondary | ICD-10-CM | POA: Diagnosis not present

## 2018-04-05 DIAGNOSIS — E559 Vitamin D deficiency, unspecified: Secondary | ICD-10-CM | POA: Diagnosis not present

## 2018-04-05 DIAGNOSIS — G459 Transient cerebral ischemic attack, unspecified: Secondary | ICD-10-CM | POA: Diagnosis not present

## 2018-04-05 DIAGNOSIS — L03039 Cellulitis of unspecified toe: Secondary | ICD-10-CM | POA: Diagnosis not present

## 2018-04-05 DIAGNOSIS — E538 Deficiency of other specified B group vitamins: Secondary | ICD-10-CM | POA: Diagnosis not present

## 2018-04-05 DIAGNOSIS — E785 Hyperlipidemia, unspecified: Secondary | ICD-10-CM | POA: Diagnosis not present

## 2018-04-05 DIAGNOSIS — E118 Type 2 diabetes mellitus with unspecified complications: Secondary | ICD-10-CM | POA: Diagnosis not present

## 2018-04-24 IMAGING — CT CT HEART MORP W/ CTA COR W/ SCORE W/ CA W/CM &/OR W/O CM
4 of 7 series · 9 of 20 positions shown, 10 images · non-contrast
Comparison: None.

CLINICAL DATA: Chest pain

EXAM:
Cardiac CTA
MEDICATIONS:
Sub lingual nitro. 4mg and lopressor 5mg IV
TECHNIQUE: The patient was scanned on a Siemens [REDACTED]ice scanner. Gantry
rotation speed was 240 msecs. Collimation was 0.6 mm. A 100 kV
prospective scan was triggered in the ascending thoracic aorta
centered around 35-75% of the R-R interval. Average HR during the
scan was 60 bpm. The 3D data set was interpreted on a dedicated work
station using MPR, MIP and VRT modes. A total of 80cc of contrast
was used.

[Series 6: best diast 74 % · axial · 0.44mm/px · z∈[+1212,+1291]mm · 3 of 398 slices shown, 4 images]
[im 100/398  vessel]
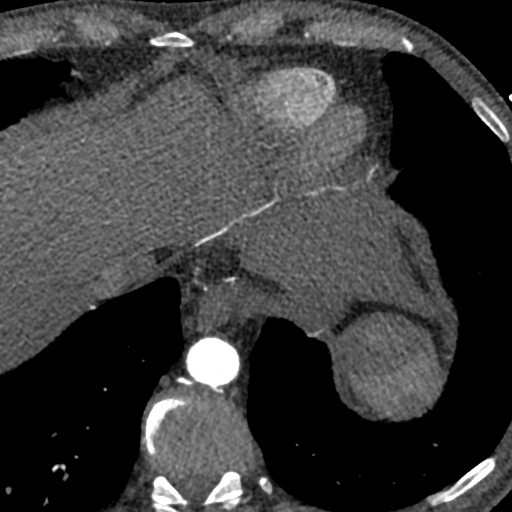
[im 100/398  lung]
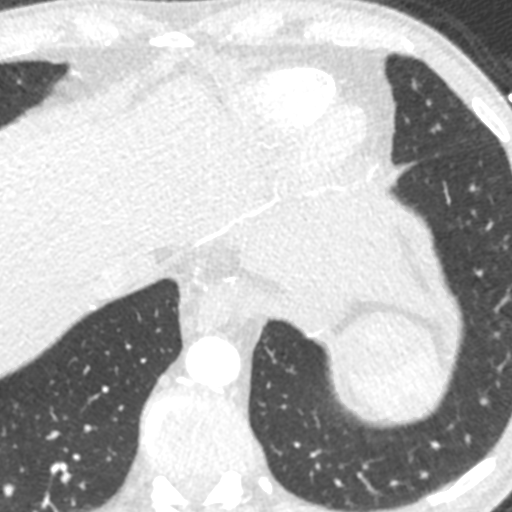
[im 199/398  vessel]
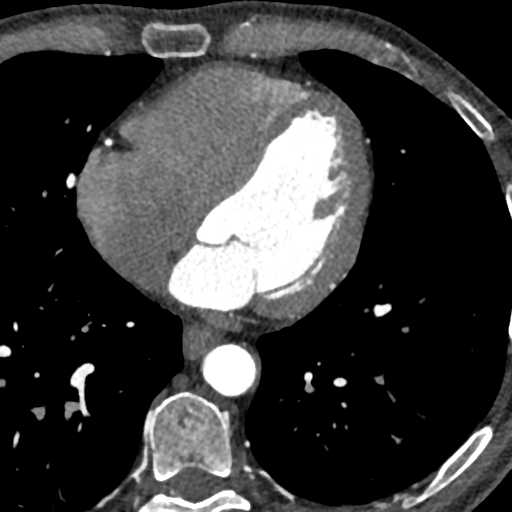
[im 298/398  vessel]
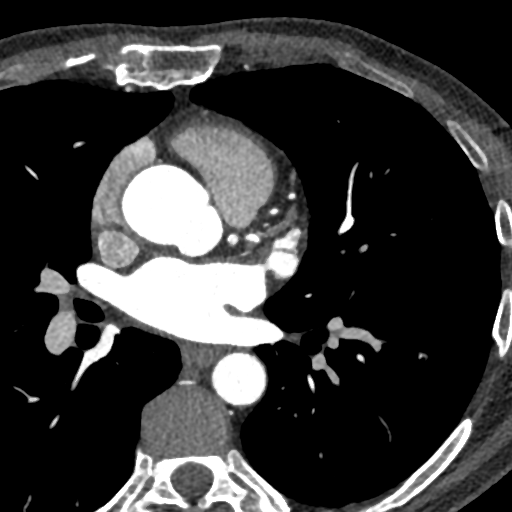

[Series 7: best syst 38 % · axial · 0.44mm/px · z∈[+1241,+1286]mm · 2 of 338 slices shown]
[im 113/338  vessel]
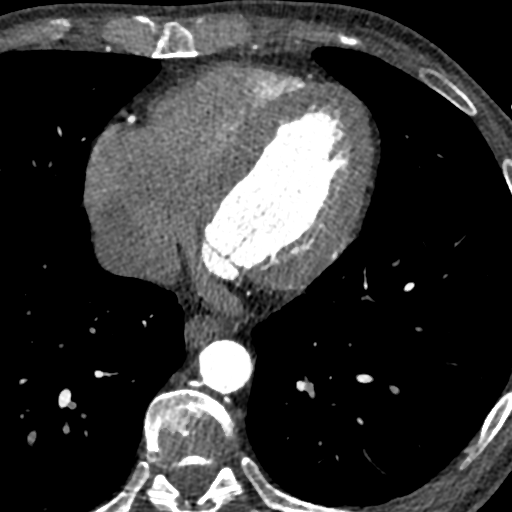
[im 225/338  vessel]
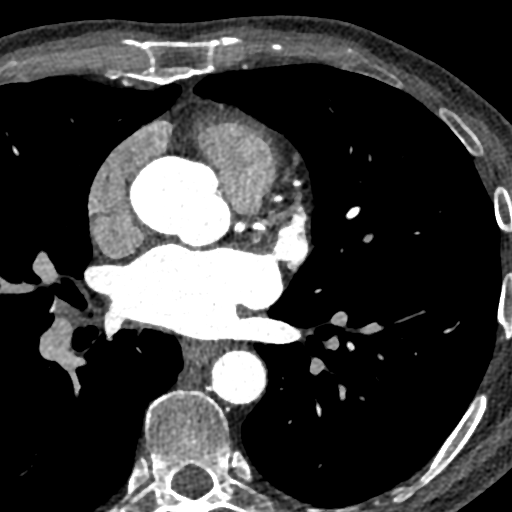

[Series 8: ts diast sharp 74 % · axial · 0.44mm/px · z∈[+1241,+1286]mm · 2 of 338 slices shown]
[im 113/338  lung]
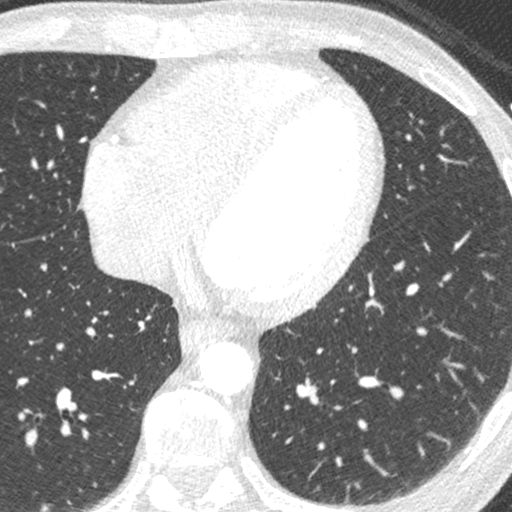
[im 225/338  lung]
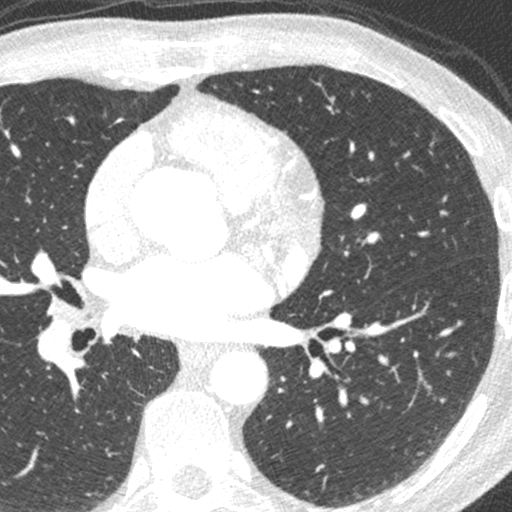

[Series 9: ts syst sharp 38 % · axial · 0.44mm/px · z∈[+1241,+1286]mm · 2 of 338 slices shown]
[im 113/338  lung]
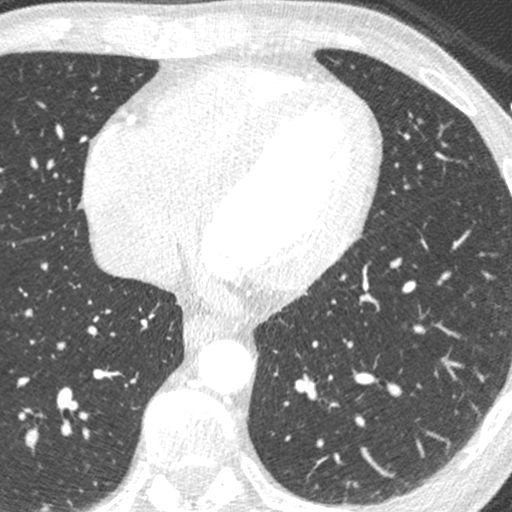
[im 225/338  lung]
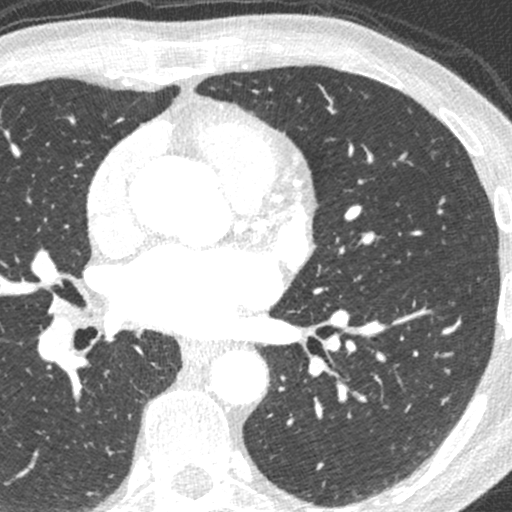

[9 of 20 positions shown; findings below may reference images not displayed]

FINDINGS: Non-cardiac: See separate report from [REDACTED].

Calcium Score:  24 Agatston units.

Coronary Arteries: Right dominant with no anomalies

LM:  No plaque or stenosis.

LAD system: Mixed plaque in the ostial and proximal LAD, no more
than mild stenosis. There was a large D1 without significant
disease.

Circumflex system:  No plaque or stenosis.

RCA system:  No plaque or stenosis.
IMPRESSION: 1. Coronary artery calcium score of 24 Agatston units, placing the
patient in the 64th percentile for age and gender, this suggests
intermediate risk for future coronary events.

2.  Mild nonobstructive disease noted in the proximal LAD.

Delmi Levan

EXAM:
OVER-READ INTERPRETATION  CT CHEST

The following report is an over-read performed by radiologist Dr.
Czarina Rickert [REDACTED] on 07/19/2017. This over-read
does not include interpretation of cardiac or coronary anatomy or
pathology. The coronary CTA interpretation by the cardiologist is
attached.
FINDINGS: Vascular: Ascending aorta upper limits normal in size at 3.9 cm.
Heart is normal size.

Mediastinum/Nodes: No adenopathy in the lower mediastinum or hila.

Lungs/Pleura: Visualized lungs clear.  No effusions.

Upper Abdomen: Imaging into the upper abdomen shows no acute
findings.

Musculoskeletal: Chest wall soft tissues are unremarkable. No acute
bony abnormality.
IMPRESSION: Ascending aorta upper limits normal at 3.9 cm. No acute extracardiac
abnormality.

## 2018-07-09 DIAGNOSIS — G608 Other hereditary and idiopathic neuropathies: Secondary | ICD-10-CM | POA: Diagnosis not present

## 2018-07-09 DIAGNOSIS — E785 Hyperlipidemia, unspecified: Secondary | ICD-10-CM | POA: Diagnosis not present

## 2018-07-09 DIAGNOSIS — Z0000 Encounter for general adult medical examination without abnormal findings: Secondary | ICD-10-CM | POA: Diagnosis not present

## 2018-07-09 DIAGNOSIS — M25519 Pain in unspecified shoulder: Secondary | ICD-10-CM | POA: Diagnosis not present

## 2018-09-12 DIAGNOSIS — R079 Chest pain, unspecified: Secondary | ICD-10-CM | POA: Diagnosis not present

## 2018-09-12 DIAGNOSIS — R51 Headache: Secondary | ICD-10-CM | POA: Diagnosis not present

## 2018-09-12 DIAGNOSIS — Z794 Long term (current) use of insulin: Secondary | ICD-10-CM | POA: Diagnosis not present

## 2018-09-12 DIAGNOSIS — R0789 Other chest pain: Secondary | ICD-10-CM | POA: Diagnosis not present

## 2018-09-12 DIAGNOSIS — R531 Weakness: Secondary | ICD-10-CM | POA: Diagnosis not present

## 2018-09-12 DIAGNOSIS — R29818 Other symptoms and signs involving the nervous system: Secondary | ICD-10-CM | POA: Diagnosis not present

## 2018-09-12 DIAGNOSIS — G459 Transient cerebral ischemic attack, unspecified: Secondary | ICD-10-CM | POA: Diagnosis not present

## 2018-09-12 DIAGNOSIS — E119 Type 2 diabetes mellitus without complications: Secondary | ICD-10-CM | POA: Diagnosis not present

## 2018-09-12 DIAGNOSIS — I749 Embolism and thrombosis of unspecified artery: Secondary | ICD-10-CM | POA: Diagnosis not present

## 2018-09-12 DIAGNOSIS — R4701 Aphasia: Secondary | ICD-10-CM | POA: Diagnosis not present

## 2018-09-12 DIAGNOSIS — J45909 Unspecified asthma, uncomplicated: Secondary | ICD-10-CM | POA: Diagnosis not present

## 2018-09-12 DIAGNOSIS — E785 Hyperlipidemia, unspecified: Secondary | ICD-10-CM | POA: Diagnosis not present

## 2018-09-12 DIAGNOSIS — I4891 Unspecified atrial fibrillation: Secondary | ICD-10-CM | POA: Diagnosis not present

## 2018-09-12 DIAGNOSIS — Z79899 Other long term (current) drug therapy: Secondary | ICD-10-CM | POA: Diagnosis not present

## 2018-09-13 DIAGNOSIS — G459 Transient cerebral ischemic attack, unspecified: Secondary | ICD-10-CM | POA: Diagnosis not present

## 2018-09-17 DIAGNOSIS — E785 Hyperlipidemia, unspecified: Secondary | ICD-10-CM | POA: Diagnosis not present

## 2018-09-17 DIAGNOSIS — R002 Palpitations: Secondary | ICD-10-CM | POA: Diagnosis not present

## 2018-09-17 DIAGNOSIS — Z79899 Other long term (current) drug therapy: Secondary | ICD-10-CM | POA: Diagnosis not present

## 2018-09-17 DIAGNOSIS — M25519 Pain in unspecified shoulder: Secondary | ICD-10-CM | POA: Diagnosis not present

## 2018-10-02 ENCOUNTER — Other Ambulatory Visit: Payer: Self-pay | Admitting: Cardiology

## 2018-10-09 DIAGNOSIS — G459 Transient cerebral ischemic attack, unspecified: Secondary | ICD-10-CM | POA: Diagnosis not present

## 2018-10-09 DIAGNOSIS — M25519 Pain in unspecified shoulder: Secondary | ICD-10-CM | POA: Diagnosis not present

## 2018-10-09 DIAGNOSIS — E118 Type 2 diabetes mellitus with unspecified complications: Secondary | ICD-10-CM | POA: Diagnosis not present

## 2018-10-09 DIAGNOSIS — E785 Hyperlipidemia, unspecified: Secondary | ICD-10-CM | POA: Diagnosis not present

## 2018-10-09 DIAGNOSIS — I2510 Atherosclerotic heart disease of native coronary artery without angina pectoris: Secondary | ICD-10-CM | POA: Diagnosis not present

## 2018-10-25 DIAGNOSIS — E118 Type 2 diabetes mellitus with unspecified complications: Secondary | ICD-10-CM | POA: Diagnosis not present

## 2018-10-25 DIAGNOSIS — H6120 Impacted cerumen, unspecified ear: Secondary | ICD-10-CM | POA: Diagnosis not present

## 2018-10-25 DIAGNOSIS — Z6824 Body mass index (BMI) 24.0-24.9, adult: Secondary | ICD-10-CM | POA: Diagnosis not present

## 2018-11-02 DIAGNOSIS — J301 Allergic rhinitis due to pollen: Secondary | ICD-10-CM | POA: Diagnosis not present

## 2018-11-02 DIAGNOSIS — Z6824 Body mass index (BMI) 24.0-24.9, adult: Secondary | ICD-10-CM | POA: Diagnosis not present

## 2018-11-02 DIAGNOSIS — H6590 Unspecified nonsuppurative otitis media, unspecified ear: Secondary | ICD-10-CM | POA: Diagnosis not present

## 2018-11-02 DIAGNOSIS — E118 Type 2 diabetes mellitus with unspecified complications: Secondary | ICD-10-CM | POA: Diagnosis not present

## 2019-01-24 DIAGNOSIS — G6289 Other specified polyneuropathies: Secondary | ICD-10-CM | POA: Diagnosis not present

## 2019-01-24 DIAGNOSIS — N529 Male erectile dysfunction, unspecified: Secondary | ICD-10-CM | POA: Diagnosis not present

## 2019-01-24 DIAGNOSIS — E785 Hyperlipidemia, unspecified: Secondary | ICD-10-CM | POA: Diagnosis not present

## 2019-01-24 DIAGNOSIS — E118 Type 2 diabetes mellitus with unspecified complications: Secondary | ICD-10-CM | POA: Diagnosis not present

## 2019-01-24 DIAGNOSIS — K219 Gastro-esophageal reflux disease without esophagitis: Secondary | ICD-10-CM | POA: Diagnosis not present

## 2019-03-24 ENCOUNTER — Other Ambulatory Visit: Payer: Self-pay

## 2019-03-24 ENCOUNTER — Emergency Department (HOSPITAL_COMMUNITY): Payer: BC Managed Care – PPO

## 2019-03-24 ENCOUNTER — Encounter (HOSPITAL_COMMUNITY): Payer: Self-pay

## 2019-03-24 ENCOUNTER — Observation Stay (HOSPITAL_COMMUNITY)
Admission: EM | Admit: 2019-03-24 | Discharge: 2019-03-26 | Disposition: A | Discharge status: Home / Self Care | Payer: BC Managed Care – PPO | Attending: Internal Medicine | Admitting: Internal Medicine

## 2019-03-24 DIAGNOSIS — R4020 Unspecified coma: Secondary | ICD-10-CM | POA: Diagnosis not present

## 2019-03-24 DIAGNOSIS — I714 Abdominal aortic aneurysm, without rupture, unspecified: Secondary | ICD-10-CM | POA: Diagnosis present

## 2019-03-24 DIAGNOSIS — R079 Chest pain, unspecified: Secondary | ICD-10-CM | POA: Diagnosis present

## 2019-03-24 DIAGNOSIS — I491 Atrial premature depolarization: Secondary | ICD-10-CM | POA: Diagnosis not present

## 2019-03-24 DIAGNOSIS — I10 Essential (primary) hypertension: Secondary | ICD-10-CM | POA: Diagnosis not present

## 2019-03-24 DIAGNOSIS — Z79899 Other long term (current) drug therapy: Secondary | ICD-10-CM | POA: Diagnosis not present

## 2019-03-24 DIAGNOSIS — R55 Syncope and collapse: Secondary | ICD-10-CM | POA: Diagnosis not present

## 2019-03-24 DIAGNOSIS — E11649 Type 2 diabetes mellitus with hypoglycemia without coma: Secondary | ICD-10-CM | POA: Insufficient documentation

## 2019-03-24 DIAGNOSIS — K4090 Unilateral inguinal hernia, without obstruction or gangrene, not specified as recurrent: Secondary | ICD-10-CM | POA: Diagnosis not present

## 2019-03-24 DIAGNOSIS — Z7902 Long term (current) use of antithrombotics/antiplatelets: Secondary | ICD-10-CM | POA: Insufficient documentation

## 2019-03-24 DIAGNOSIS — Z86718 Personal history of other venous thrombosis and embolism: Secondary | ICD-10-CM | POA: Insufficient documentation

## 2019-03-24 DIAGNOSIS — E785 Hyperlipidemia, unspecified: Secondary | ICD-10-CM | POA: Insufficient documentation

## 2019-03-24 DIAGNOSIS — N200 Calculus of kidney: Secondary | ICD-10-CM | POA: Insufficient documentation

## 2019-03-24 DIAGNOSIS — I2510 Atherosclerotic heart disease of native coronary artery without angina pectoris: Secondary | ICD-10-CM | POA: Insufficient documentation

## 2019-03-24 DIAGNOSIS — R42 Dizziness and giddiness: Secondary | ICD-10-CM | POA: Diagnosis not present

## 2019-03-24 DIAGNOSIS — K429 Umbilical hernia without obstruction or gangrene: Secondary | ICD-10-CM | POA: Diagnosis not present

## 2019-03-24 DIAGNOSIS — Z8673 Personal history of transient ischemic attack (TIA), and cerebral infarction without residual deficits: Secondary | ICD-10-CM | POA: Diagnosis not present

## 2019-03-24 DIAGNOSIS — R0789 Other chest pain: Secondary | ICD-10-CM | POA: Diagnosis not present

## 2019-03-24 DIAGNOSIS — E160 Drug-induced hypoglycemia without coma: Secondary | ICD-10-CM

## 2019-03-24 DIAGNOSIS — I429 Cardiomyopathy, unspecified: Secondary | ICD-10-CM | POA: Insufficient documentation

## 2019-03-24 DIAGNOSIS — Z794 Long term (current) use of insulin: Secondary | ICD-10-CM

## 2019-03-24 DIAGNOSIS — Z1159 Encounter for screening for other viral diseases: Secondary | ICD-10-CM | POA: Insufficient documentation

## 2019-03-24 DIAGNOSIS — J45909 Unspecified asthma, uncomplicated: Secondary | ICD-10-CM | POA: Diagnosis not present

## 2019-03-24 DIAGNOSIS — Z20828 Contact with and (suspected) exposure to other viral communicable diseases: Secondary | ICD-10-CM | POA: Diagnosis not present

## 2019-03-24 DIAGNOSIS — T383X5A Adverse effect of insulin and oral hypoglycemic [antidiabetic] drugs, initial encounter: Secondary | ICD-10-CM

## 2019-03-24 DIAGNOSIS — E119 Type 2 diabetes mellitus without complications: Secondary | ICD-10-CM

## 2019-03-24 DIAGNOSIS — I700 Atherosclerosis of aorta: Secondary | ICD-10-CM | POA: Diagnosis not present

## 2019-03-24 DIAGNOSIS — I712 Thoracic aortic aneurysm, without rupture: Secondary | ICD-10-CM | POA: Diagnosis not present

## 2019-03-24 HISTORY — DX: Type 2 diabetes mellitus with other circulatory complications: E11.59

## 2019-03-24 HISTORY — DX: Hypertension secondary to endocrine disorders: I15.2

## 2019-03-24 LAB — CBC WITH DIFFERENTIAL/PLATELET
Abs Immature Granulocytes: 0.02 10*3/uL (ref 0.00–0.07)
Basophils Absolute: 0 10*3/uL (ref 0.0–0.1)
Basophils Relative: 1 %
Eosinophils Absolute: 0.1 10*3/uL (ref 0.0–0.5)
Eosinophils Relative: 2 %
HCT: 40.5 % (ref 39.0–52.0)
Hemoglobin: 14.3 g/dL (ref 13.0–17.0)
Immature Granulocytes: 0 %
Lymphocytes Relative: 34 %
Lymphs Abs: 2 10*3/uL (ref 0.7–4.0)
MCH: 29.6 pg (ref 26.0–34.0)
MCHC: 35.3 g/dL (ref 30.0–36.0)
MCV: 83.9 fL (ref 80.0–100.0)
Monocytes Absolute: 0.5 10*3/uL (ref 0.1–1.0)
Monocytes Relative: 8 %
Neutro Abs: 3.3 10*3/uL (ref 1.7–7.7)
Neutrophils Relative %: 55 %
Platelets: 242 10*3/uL (ref 150–400)
RBC: 4.83 MIL/uL (ref 4.22–5.81)
RDW: 12 % (ref 11.5–15.5)
WBC: 6 10*3/uL (ref 4.0–10.5)
nRBC: 0 % (ref 0.0–0.2)

## 2019-03-24 LAB — RAPID URINE DRUG SCREEN, HOSP PERFORMED
Amphetamines: NOT DETECTED
Barbiturates: NOT DETECTED
Benzodiazepines: NOT DETECTED
Cocaine: NOT DETECTED
Opiates: NOT DETECTED
Tetrahydrocannabinol: NOT DETECTED

## 2019-03-24 LAB — BASIC METABOLIC PANEL
Anion gap: 11 (ref 5–15)
BUN: 14 mg/dL (ref 6–20)
CO2: 23 mmol/L (ref 22–32)
Calcium: 9.2 mg/dL (ref 8.9–10.3)
Chloride: 106 mmol/L (ref 98–111)
Creatinine, Ser: 1.01 mg/dL (ref 0.61–1.24)
GFR calc Af Amer: >60 mL/min (ref >60)
GFR calc non Af Amer: >60 mL/min (ref >60)
Glucose, Bld: 118 mg/dL — ABNORMAL HIGH (ref 70–99)
Potassium: 3.9 mmol/L (ref 3.5–5.1)
Sodium: 140 mmol/L (ref 135–145)

## 2019-03-24 LAB — SARS CORONAVIRUS 2 BY RT PCR (HOSPITAL ORDER, PERFORMED IN ~~LOC~~ HOSPITAL LAB): SARS Coronavirus 2: NEGATIVE

## 2019-03-24 LAB — GLUCOSE, CAPILLARY
Glucose-Capillary: 33 mg/dL — CL (ref 70–99)
Glucose-Capillary: 81 mg/dL (ref 70–99)
Glucose-Capillary: 236 mg/dL — ABNORMAL HIGH (ref 70–99)
Glucose-Capillary: 263 mg/dL — ABNORMAL HIGH (ref 70–99)

## 2019-03-24 LAB — CBG MONITORING, ED
Glucose-Capillary: 106 mg/dL — ABNORMAL HIGH (ref 70–99)
Glucose-Capillary: 45 mg/dL — ABNORMAL LOW (ref 70–99)
Glucose-Capillary: 125 mg/dL — ABNORMAL HIGH (ref 70–99)

## 2019-03-24 LAB — TROPONIN I (HIGH SENSITIVITY)
Troponin I (High Sensitivity): 2 ng/L (ref <18)
Troponin I (High Sensitivity): 3 ng/L (ref <18)

## 2019-03-24 MED ORDER — DEXTROSE 50 % IV SOLN
INTRAVENOUS | Status: AC
  Filled 2019-03-24: qty 50

## 2019-03-24 MED ORDER — DEXTROSE-NACL 5-0.45 % IV SOLN
INTRAVENOUS | Status: DC
  Filled 2019-03-24: qty 1000

## 2019-03-24 MED ORDER — CARVEDILOL 6.25 MG PO TABS
6.2500 mg | ORAL_TABLET | Freq: Two times a day (BID) | ORAL | Status: DC
  Administered 2019-03-25 – 2019-03-26 (×3): 6.25 mg via ORAL
  Filled 2019-03-24 (×3): qty 1

## 2019-03-24 MED ORDER — INSULIN GLARGINE 100 UNITS/ML SOLOSTAR PEN: 70.0000 [IU] | PEN_INJECTOR | Freq: Every day | SUBCUTANEOUS | Status: DC

## 2019-03-24 MED ORDER — CLOPIDOGREL BISULFATE 75 MG PO TABS
75.0000 mg | ORAL_TABLET | Freq: Every day | ORAL | Status: DC
  Administered 2019-03-25 – 2019-03-26 (×2): 75 mg via ORAL
  Filled 2019-03-24 (×2): qty 1

## 2019-03-24 MED ORDER — ACETAMINOPHEN 325 MG PO TABS
650.0000 mg | ORAL_TABLET | ORAL | Status: DC | PRN
  Administered 2019-03-26: 650 mg via ORAL
  Filled 2019-03-24: qty 2

## 2019-03-24 MED ORDER — ATORVASTATIN CALCIUM 40 MG PO TABS
40.0000 mg | ORAL_TABLET | Freq: Every day | ORAL | Status: DC
  Administered 2019-03-25 – 2019-03-26 (×2): 40 mg via ORAL
  Filled 2019-03-24 (×2): qty 1

## 2019-03-24 MED ORDER — MONTELUKAST SODIUM 10 MG PO TABS
10.0000 mg | ORAL_TABLET | Freq: Every day | ORAL | Status: DC
  Administered 2019-03-25 – 2019-03-26 (×2): 10 mg via ORAL
  Filled 2019-03-24 (×2): qty 1

## 2019-03-24 MED ORDER — INSULIN GLARGINE 100 UNIT/ML ~~LOC~~ SOLN: 40.0000 [IU] | Freq: Every day | SUBCUTANEOUS | Status: DC

## 2019-03-24 MED ORDER — DEXTROSE 5 % IV SOLN: INTRAVENOUS | Status: DC

## 2019-03-24 MED ORDER — ONDANSETRON HCL 4 MG/2ML IJ SOLN: 4.0000 mg | Freq: Four times a day (QID) | INTRAMUSCULAR | Status: DC | PRN

## 2019-03-24 MED ORDER — INSULIN ASPART 100 UNIT/ML ~~LOC~~ SOLN: 0.0000 [IU] | Freq: Three times a day (TID) | SUBCUTANEOUS | Status: DC

## 2019-03-24 MED ORDER — SODIUM CHLORIDE 0.9 % IV SOLN: INTRAVENOUS | Status: DC

## 2019-03-24 MED ORDER — FOLIC ACID 1 MG PO TABS
1.0000 mg | ORAL_TABLET | Freq: Every day | ORAL | Status: DC
  Administered 2019-03-25 – 2019-03-26 (×2): 1 mg via ORAL
  Filled 2019-03-24 (×2): qty 1

## 2019-03-24 MED ORDER — ENOXAPARIN SODIUM 40 MG/0.4ML ~~LOC~~ SOLN
40.0000 mg | SUBCUTANEOUS | Status: DC
  Administered 2019-03-25: 40 mg via SUBCUTANEOUS
  Filled 2019-03-24: qty 0.4

## 2019-03-24 MED ORDER — IOHEXOL 350 MG/ML SOLN
100.0000 mL | Freq: Once | INTRAVENOUS | Status: AC | PRN
  Administered 2019-03-24: 100 mL via INTRAVENOUS

## 2019-03-24 MED ORDER — SULFASALAZINE 500 MG PO TABS
1500.0000 mg | ORAL_TABLET | Freq: Two times a day (BID) | ORAL | Status: DC
  Administered 2019-03-24 – 2019-03-26 (×4): 1500 mg via ORAL
  Filled 2019-03-24 (×5): qty 3

## 2019-03-24 MED ORDER — DEXTROSE 50 % IV SOLN: 1.0000 | INTRAVENOUS | Status: DC | PRN

## 2019-03-24 NOTE — ED Notes (Addendum)
CBG 45, Dr. Maryan Rued notified.  Gave 8oz orange juice and graham crackers.

## 2019-03-24 NOTE — H&P (Addendum)
History and Physical    Patrick Curry ZOX:[id number] DOB: 07-08-1961 DOA: 03/24/2019  Referring MD/NP/PA: Shanna Cisco, PA-C PCP: Guadalupe Maple., MD  Patient coming from: home  Chief Complaint: Chest pain  I have personally briefly reviewed patient's old medical records in [facility]   HPI: Patrick Curry is a 58 y.o. 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  This morning while at work around 8:30 AM he reported having right arm numbness and sharp right-sided chest pain.  He reports having some lightheadedness before apparently losing consciousness. The next thing he recalls was being surrounded by firemen.  Per EMS he was noted to be unconscious with decreased respiratory rate.  Patient was bagged masked with 4 rescue breaths, and then reported to have come to.  No CPR was performed.  Patient denies having any shortness of breath, but still complains of some chest discomfort.  Denies any tobacco use, illicit drug use, abdominal pain, nausea, vomiting, or diaphoresis.  EMS noted that blood sugars were within normal limits and he was given 324 mg of aspirin in route by EMS.  He takes Plavix daily for the previous history of CVA.  ED Course: On admission into the emergency department patient was noted to be afebrile, blood pressures 126/77-153/78, and all other vital signs maintained.  Lab unremarkable including initial high-sensitivity troponin.  CT angiogram of the chest abdomen and pelvis revealed no signs of a PE or dissection.  Repeat CBG in department was noted to be 45.  He is given 8 ounces of once juice and graham crackers.  Review of Systems  Constitutional: Negative for chills, fever and malaise/fatigue.  HENT: Negative for nosebleeds and sinus pain.   Eyes: Negative for photophobia and pain.  Respiratory: Negative for hemoptysis and shortness of breath.    Cardiovascular: Positive for chest pain. Negative for leg swelling.  Gastrointestinal: Negative for abdominal pain, nausea and vomiting.  Genitourinary: Negative for dysuria and hematuria.  Musculoskeletal: Negative for joint pain and myalgias.  Skin: Negative for itching.  Neurological: Positive for loss of consciousness. Negative for focal weakness.  Psychiatric/Behavioral: Negative for hallucinations and substance abuse.    Past Medical History:  Diagnosis Date  . Asthma 06/20/2017  . Carotid stenosis 10/08/2015  . Diarrhea 10/08/2015  . DVT (deep venous thrombosis) (HCC)   . Hemorrhagic colitis 02/12/2015  . Hypercholesterolemia 06/20/2017  . IBD (inflammatory bowel disease) 10/08/2015  . Ketoacidosis 02/12/2015  . Kidney stones 06/20/2017  . Migraine 03/25/2014  . Severe sepsis (HCC) 02/12/2015  . Stroke (HCC) 03/25/2014  . Type 2 diabetes mellitus without complication (HCC) 02/12/2015    Past Surgical History:  Procedure Laterality Date  . CHOLECYSTECTOMY    . COLON RESECTION     Partial, after trauma  . IVC FILTER INSERTION  2011     reports that he has never smoked. He has never used smokeless tobacco. He reports that he does not drink alcohol or use drugs.  No Known Allergies  Family History  Problem Relation Age of Onset  . Hypertension Mother   . Heart disease Mother   . Diabetes Mother   . Diabetes Father     Prior to Admission medications   Medication Sig Start Date End Date Taking? Authorizing Provider  atorvastatin (LIPITOR) 40 MG tablet Take 40 mg by mouth daily.   Yes [provider]  carvedilol (COREG) 6.25  MG tablet TAKE ONE TABLET BY MOUTH TWICE DAILY WITH FOOD Patient taking differently: Take 6.25 mg by mouth 2 (two) times daily with a meal.  10/13/17  Yes Munley, Hilton Cork, MD  clopidogrel (PLAVIX) 75 MG tablet Take 75 mg by mouth daily.   Yes [provider]  cyanocobalamin 2000 MCG tablet Take 2,000 mcg by mouth daily.    Yes [provider]  folic acid (FOLVITE) 1 MG tablet Take 1 mg by mouth daily.  08/12/16  Yes [provider]  insulin glargine (LANTUS) 100 unit/mL SOPN Inject 70 Units into the skin daily.   Yes [provider]  Lactobacillus ([employer] PROBIOTIC ACIDOPHILUS) 10 MG CAPS Take 1 capsule by mouth daily.  02/18/15  Yes [provider]  metFORMIN (GLUCOPHAGE) 1000 MG tablet Take 1,000 mg by mouth 2 (two) times daily with a meal.  01/06/05  Yes [provider]  montelukast (SINGULAIR) 10 MG tablet Take 10 mg by mouth daily.  10/05/15  Yes [provider]  nitroGLYCERIN (NITROSTAT) 0.4 MG SL tablet  06/21/17  Yes [provider]  sildenafil (REVATIO) 20 MG tablet Take 40-100 mg by mouth as needed.  09/18/15  Yes [provider]  sitaGLIPtin (JANUVIA) 100 MG tablet Take 100 mg by mouth.   Yes [provider]  sulfaSALAzine (AZULFIDINE) 500 MG tablet Take 1,500 mg by mouth 2 (two) times daily.  08/12/16  Yes [provider]  SURE COMFORT PEN NEEDLES 31G X 8 MM MISC See admin instructions. 06/16/17  Yes [provider]  Vitamin D, Ergocalciferol, (DRISDOL) 50000 units CAPS capsule Take 50,000 Units by mouth every 7 (seven) days. Mondays 09/18/15  Yes [provider]    Physical Exam:  Constitutional: Middle-age male currently in NAD, calm, comfortable Vitals:   03/24/19 1445 03/24/19 1500 03/24/19 1515 03/24/19 1530  BP: 133/77 139/77 136/76 133/86  Pulse: 70 68 67 67  Resp: 16 15 15 14   Temp:      TempSrc:      SpO2: 99% 99% 100% 99%   Eyes: PERRL, lids and conjunctivae normal ENMT: Mucous membranes are moist. Posterior pharynx clear of any exudate or lesions.  Neck: normal, supple, no masses, no thyromegaly Respiratory: clear to auscultation bilaterally, no wheezing, no crackles. Normal respiratory effort. No accessory muscle use.  Cardiovascular: Regular rate and rhythm, no murmurs / rubs / gallops. No extremity  edema. 2+ pedal pulses. No carotid bruits.  Abdomen: no tenderness, no masses palpated. No hepatosplenomegaly. Bowel sounds positive.  Musculoskeletal: no clubbing / cyanosis. No joint deformity upper and lower extremities. Good ROM, no contractures. Normal muscle tone.  Skin: no rashes, lesions, ulcers. No induration Neurologic: CN 2-12 grossly intact. Sensation intact, DTR normal. Strength 5/5 in all 4.  Psychiatric: Normal judgment and insight. Alert and oriented x 3. Normal mood.     Labs on Admission: I have personally reviewed following labs and imaging studies  CBC: Recent Labs  Lab 03/24/19 1330  WBC 6.0  NEUTROABS 3.3  HGB 14.3  HCT 40.5  MCV 83.9  PLT 517   Basic Metabolic Panel: Recent Labs  Lab 03/24/19 1223  NA 140  K 3.9  CL 106  CO2 23  GLUCOSE 118*  BUN 14  CREATININE 1.01  CALCIUM 9.2   GFR: CrCl cannot be calculated (Unknown ideal weight.). Liver Function Tests: No results for input(s): AST, ALT, ALKPHOS, BILITOT, PROT, ALBUMIN in the last 168 hours. No results for input(s): LIPASE, AMYLASE in  the last 168 hours. No results for input(s): AMMONIA in the last 168 hours. Coagulation Profile: No results for input(s): INR, PROTIME in the last 168 hours. Cardiac Enzymes: No results for input(s): CKTOTAL, CKMB, CKMBINDEX, TROPONINI in the last 168 hours. BNP (last 3 results) No results for input(s): PROBNP in the last 8760 hours. HbA1C: No results for input(s): HGBA1C in the last 72 hours. CBG: Recent Labs  Lab 03/24/19 1200 03/24/19 1216 03/24/19 1236  GLUCAP 45* 106* 125*   Lipid Profile: No results for input(s): CHOL, HDL, LDLCALC, TRIG, CHOLHDL, LDLDIRECT in the last 72 hours. Thyroid Function Tests: No results for input(s): TSH, T4TOTAL, FREET4, T3FREE, THYROIDAB in the last 72 hours. Anemia Panel: No results for input(s): VITAMINB12, FOLATE, FERRITIN, TIBC, IRON, RETICCTPCT in the last 72 hours. Urine analysis: No results found for:  COLORURINE, APPEARANCEUR, LABSPEC, PHURINE, GLUCOSEU, HGBUR, BILIRUBINUR, KETONESUR, PROTEINUR, UROBILINOGEN, NITRITE, LEUKOCYTESUR Sepsis Labs: No results found for this or any previous visit (from the past 240 hour(s)).   Radiological Exams on Admission: Dg Chest 2 View  Result Date: 03/24/2019 CLINICAL DATA:  Syncopal episode.  Chest pain. EXAM: CHEST - 2 VIEW COMPARISON:  06/19/2017 FINDINGS: Midline trachea. Normal heart size and mediastinal contours. Numerous leads and wires project over the chest. No pleural effusion or pneumothorax. Clear lungs. Probable nipple shadow projecting over the left lung base; CTA of the chest is pending. IMPRESSION: No acute cardiopulmonary disease. Electronically Signed   By: Jeronimo GreavesKyle  Talbot M.D.   On: 03/24/2019 14:10   Ct Angio Chest/abd/pel For Dissection W And/or Wo Contrast  Result Date: 03/24/2019 CLINICAL DATA:  Chest pain.  Right hand tingling. EXAM: CT ANGIOGRAPHY CHEST, ABDOMEN AND PELVIS TECHNIQUE: Multidetector CT imaging through the chest, abdomen and pelvis was performed using the standard protocol during bolus administration of intravenous contrast. Multiplanar reconstructed images and MIPs were obtained and reviewed to evaluate the vascular anatomy. CONTRAST:  100mL OMNIPAQUE IOHEXOL 350 MG/ML SOLN COMPARISON:  CT the abdomen and pelvis January 23, 2015. CT of the chest February 22, 2012. FINDINGS: CTA CHEST FINDINGS Cardiovascular: Coronary artery calcifications are seen in the left main and anterior descending coronary arteries. Probable mild coronary artery calcifications in the right coronary artery. The heart is unremarkable. Central pulmonary arteries are normal in caliber with no filling defects identified. Minimal atherosclerotic changes in the thoracic aorta. The ascending thoracic aorta measures up to 4.2 cm just proximal to the arch. This is not significantly changed, measuring 4.1 cm previously. The remainder of the thoracic aorta is normal in caliber  with no other evidence of aneurysm. No dissection. The left subclavian artery is normal in appearance. The left carotid artery, right carotid artery, and right subclavian artery arise from a common trunk. There is mild atherosclerotic change in the left common carotid artery seen on image 1. The right common carotid artery is normal in appearance. Visualized portions of the right subclavian artery are normal in appearance. A portion is obscured by streak artifact off the right subclavian vein which is contrast filled. Visualized portions of the vertebral arteries are normal. Mediastinum/Nodes: No enlarged mediastinal, hilar, or axillary lymph nodes. Thyroid gland, trachea, and esophagus demonstrate no significant findings. Lungs/Pleura: A nodular density in the right lung on series 6, image 34 is unchanged since 2013, not requiring follow-up. No other abnormalities in the lungs. The central airways are normal. No pneumothorax. Musculoskeletal: See below. Review of the MIP images confirms the above findings. CTA ABDOMEN AND PELVIS FINDINGS VASCULAR Aorta: Mild atherosclerotic  changes seen in the nonaneurysmal abdominal aorta. No dissection. Celiac: Patent without evidence of aneurysm, dissection, vasculitis or significant stenosis. SMA: Patent without evidence of aneurysm, dissection, vasculitis or significant stenosis. Renals: Both renal arteries are patent without evidence of aneurysm, dissection, vasculitis, fibromuscular dysplasia or significant stenosis. IMA: Patent without evidence of aneurysm, dissection, vasculitis or significant stenosis. Iliac and femoral arteries: The iliac and femoral arteries are widely patent and well opacified with no aneurysm or dissection. Minimal atherosclerotic changes. Inflow: Patent without evidence of aneurysm, dissection, vasculitis or significant stenosis. Veins: No obvious venous abnormality within the limitations of this arterial phase study. Review of the MIP images  confirms the above findings. NON-VASCULAR Hepatobiliary: Evaluation is limited due to arterial phase imaging. Within this limitation, no abnormalities are identified. The patient is status post cholecystectomy. Pancreas: Unremarkable. No pancreatic ductal dilatation or surrounding inflammatory changes. Spleen: Normal in size without focal abnormality. Adrenals/Urinary Tract: Bilateral renal stones are identified. The largest stone is seen on the right measuring up to 13 mm. No hydronephrosis or perinephric stranding identified. The ureters and bladder are normal. Stomach/Bowel: A few jejunal loops appear somewhat thick walled such as on axial image 181 of series 7. No adjacent stranding. No obstruction identified. The remainder of the small bowel is normal. There is moderate fecal loading in the colon. The colon is otherwise normal. The appendix is not seen but there is no secondary evidence of appendicitis. Lymphatic: No adenopathy is identified. Reproductive: Prostate is unremarkable. Other: There is a fat containing right inguinal hernia which is small. There is a small periumbilical fat containing hernia hernia. No free air free fluid. Musculoskeletal: Mild anterior wedging of T12 is stable. No other bony abnormalities are identified. Probable previous sternal fracture. Review of the MIP images confirms the above findings. IMPRESSION: 1. 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 No dissection in the thoracic or abdominal aorta. Mild atherosclerotic changes in the thoracic and abdominal aorta. No cause for the patient's left arm symptoms identified. Recommend annual imaging followup by CTA or MRA. This recommendation follows 2010 ACCF/AHA/AATS/ACR/ASA/SCA/SCAI/SIR/STS/SVM Guidelines for the Diagnosis and Management of Patients with Thoracic Aortic Disease. Circulation. 2010; 121: V425-Z563: E266-e369. Aortic aneurysm NOS (ICD10-I71.9) 2. Coronary artery calcifications  most prominent in the left main and LAD. There appears to be more mild involvement of the right coronary artery. 3. Nonobstructive renal stones. A 13 mm stone is seen in the lower pole the right kidney. 4. There are a few jejunal loops which demonstrate prominence of the wall and possible thickening. This could be due to the lack of distention. In the appropriate clinical setting, enteritis could have a similar appearance. Recommend clinical correlation. 5. Small fat containing right inguinal hernia. 6. Chronic compression fracture of T12, mild. Electronically Signed   By: Gerome Samavid  Williams III M.D   On: 03/24/2019 15:07    EKG: Independently reviewed.  Normal sinus rhythm at 69 bpm  Assessment/Plan Atypical chest pain, syncope: Acute.  Patient presented work with complaints of right-sided chest pain arm numbness.  Subsequently, reported to have had a syncopal event.  CT angiogram negative for any signs of pulmonary embolus or dissection, but did show more calcifications in the left main, LAD, and mild more involvement in the right coronary artery. -Admit to a cardiac telemetry bed -Trend high-sensitivity troponins -Check orthostatic vital signs  -Check UDS -Check echocardiogram  -Check lipid panel in a.m. -Cardiology consulted to see in a.m  Coronary artery disease: Patient  had negative cardiac perfusion study performed June 2018 with mild coronary artery disease noted, and was evaluated by Dr. Dulce Sellar at that time.  CTA imaging from today showing progression of disease.   Diabetes mellitus type 2 with hypoglycemia: Patient noted to have low blood sugars 45 on admission. -Hypoglycemic protocols -Hold metformin and Januvia -Decrease Lantus from 70 to 40 units for possible need of procedure -CBGs q. before meals and at bedtime with sensitive SSI  History of CVA: Patient reports pain status post TPA and has no residual deficits. -Continue Plavix  AAA: 4.2 ascending thoracic aortic aneurysm noted  on CTA that appears unchanged since 2013.  Essential hypertension -Continue Coreg  Nephrolithiasis: Patient noted to have nonobstructing stones on CT angiogram.  One noted to be 13 mm in the lower right pole.  Hyperlipidemia -Follow-up lipid panel -Continue atorvastatin  DVT prophylaxis: Lovenox Code Status: Full Family Communication: No family present at bedside Disposition Plan: Possible discharge home in a.m. Consults called: None Admission status: Observation  Clydie Braun MD [facility] Pager [phone]   If 7PM-7AM, please contact night-coverage [email or web address] Password [facility]  03/24/2019, 4:42 PM

## 2019-03-24 NOTE — Progress Notes (Signed)
Patient found to have recurrent episode of hypoglycemia with blood sugar of 33.  Holding all insulin at this time.  Recommend giving amp of dextrose as needed.  Changed 5% dextrose IV fluids at 75 mL/h.

## 2019-03-24 NOTE — Progress Notes (Addendum)
Patient received to floor from ED.  Check CBG-  33.  Patient asymptomatic(no sweating, confusion, etc.)  Some weakness noted when walking into room from gurney   Gave patient O.j, crackers, sandwich, apple sauce.    Recheck 15 minutes later-  CBG -81

## 2019-03-24 NOTE — ED Provider Notes (Signed)
[facility] EMERGENCY DEPARTMENT Provider Note   CSN: [id number] Arrival date & time: 03/24/19  1117    History   Chief Complaint Chief Complaint  Patient presents with   Chest Pain   Loss of Consciousness    HPI Patrick Curry is a 58 y.o. male.     The history is provided by the patient and medical records. No language interpreter was used.   Patrick Curry is a 58 y.o. male  with a PMH of CAD, DM, HLD, prior stroke on ASA who presents to the Emergency Department complaining of sharp central chest pain which began this morning while he was at work. He tells me that he remembers feeling a sharp pain for maybe a couple of minutes, then his next memory is the firemen around him. Per EMS, he was found unconscious with decreased RR. He was given 4 rescue breaths then "came to". No CPR performed. Denies any shortness of breath. Still having mild central chest discomfort. Does report some intermittent tingling to his right fingers. Denies any prodromal symptoms prior to syncopal episode. Was given a 324 ASA by EMS. He is not on anti-coagulation. No abdominal pain, n/v, diaphoresis.    Past Medical History:  Diagnosis Date   Asthma 06/20/2017   Carotid stenosis 10/08/2015   Diarrhea 10/08/2015   DVT (deep venous thrombosis) (HCC)    Hemorrhagic colitis 02/12/2015   Hypercholesterolemia 06/20/2017   IBD (inflammatory bowel disease) 10/08/2015   Ketoacidosis 02/12/2015   Kidney stones 06/20/2017   Migraine 03/25/2014   Severe sepsis ([place]) 02/12/2015   Stroke ([place]) 03/25/2014   Type 2 diabetes mellitus without complication ([place]) 0/86/7619    Patient Active Problem List   Diagnosis Date Noted   CAD in native artery 06/22/2017   Abnormal myocardial perfusion study 06/21/2017   Chest pain in adult 06/21/2017   Hypercholesterolemia 06/20/2017   Asthma 06/20/2017   Kidney stones 06/20/2017   Carotid stenosis 10/08/2015   Diarrhea 10/08/2015   IBD  (inflammatory bowel disease) 10/08/2015   Hemorrhagic colitis 02/12/2015   Ketoacidosis 02/12/2015   Severe sepsis ([place]) 02/12/2015   Type 2 diabetes mellitus without complication ([place]) 50/93/2671   Migraine 03/25/2014   Stroke ([place]) 03/25/2014    Past Surgical History:  Procedure Laterality Date   CHOLECYSTECTOMY     COLON RESECTION     Partial, after trauma   IVC FILTER INSERTION  2011        Home Medications    Prior to Admission medications   Medication Sig Start Date End Date Taking? Authorizing Provider  atorvastatin (LIPITOR) 40 MG tablet Take 40 mg by mouth daily.   Yes [provider]  carvedilol (COREG) 6.25 MG tablet TAKE ONE TABLET BY MOUTH TWICE DAILY WITH FOOD Patient taking differently: Take 6.25 mg by mouth 2 (two) times daily with a meal.  10/13/17  Yes Munley, Hilton Cork, MD  clopidogrel (PLAVIX) 75 MG tablet Take 75 mg by mouth daily.   Yes [provider]  cyanocobalamin 2000 MCG tablet Take 2,000 mcg by mouth daily.    Yes [provider]  folic acid (FOLVITE) 1 MG tablet Take 1 mg by mouth daily.  08/12/16  Yes [provider]  insulin glargine (LANTUS) 100 unit/mL SOPN Inject 70 Units into the skin daily.   Yes [provider]  Lactobacillus ([employer] PROBIOTIC ACIDOPHILUS) 10 MG CAPS Take 1 capsule by mouth daily.  02/18/15  Yes [provider]  metFORMIN (GLUCOPHAGE) 1000 MG  tablet Take 1,000 mg by mouth 2 (two) times daily with a meal.  01/06/05  Yes [provider]  montelukast (SINGULAIR) 10 MG tablet Take 10 mg by mouth daily.  10/05/15  Yes [provider]  nitroGLYCERIN (NITROSTAT) 0.4 MG SL tablet  06/21/17  Yes [provider]  sildenafil (REVATIO) 20 MG tablet Take 40-100 mg by mouth as needed.  09/18/15  Yes [provider]  sitaGLIPtin (JANUVIA) 100 MG tablet Take 100 mg by mouth.   Yes [provider]  sulfaSALAzine (AZULFIDINE) 500 MG tablet Take 1,500  mg by mouth 2 (two) times daily.  08/12/16  Yes [provider]  SURE COMFORT PEN NEEDLES 31G X 8 MM MISC See admin instructions. 06/16/17  Yes [provider]  Vitamin D, Ergocalciferol, (DRISDOL) 50000 units CAPS capsule Take 50,000 Units by mouth every 7 (seven) days. Mondays 09/18/15  Yes [provider]    Family History Family History  Problem Relation Age of Onset   Hypertension Mother    Heart disease Mother    Diabetes Mother    Diabetes Father     Social History Social History   Tobacco Use   Smoking status: Never Smoker   Smokeless tobacco: Never Used  Substance Use Topics   Alcohol use: No    Frequency: Never   Drug use: No     Allergies   Patient has no known allergies.   Review of Systems Review of Systems  Respiratory: Negative for cough and shortness of breath.   Cardiovascular: Positive for chest pain. Negative for palpitations and leg swelling.  Neurological: Positive for syncope and numbness (Right fingers). Negative for weakness.  All other systems reviewed and are negative.    Physical Exam Updated Vital Signs BP 126/77    Pulse 64    Temp 98.2 F (36.8 C) (Oral)    Resp 16    SpO2 98%   Physical Exam Vitals signs and nursing note reviewed.  Constitutional:      General: He is not in acute distress.    Appearance: He is well-developed.  HENT:     Head: Normocephalic and atraumatic.  Neck:     Musculoskeletal: Neck supple.  Cardiovascular:     Rate and Rhythm: Normal rate and regular rhythm.     Heart sounds: Normal heart sounds. No murmur.  Pulmonary:     Effort: Pulmonary effort is normal. No respiratory distress.     Breath sounds: Normal breath sounds. No stridor. No wheezing or rhonchi.  Chest:     Chest wall: No tenderness.  Abdominal:     General: There is no distension.     Palpations: Abdomen is soft.     Tenderness: There is no abdominal tenderness.  Musculoskeletal: Normal range of motion.         General: No tenderness.     Right lower leg: No edema.     Left lower leg: No edema.  Skin:    General: Skin is warm and dry.  Neurological:     Mental Status: He is alert and oriented to person, place, and time.      ED Treatments / Results  Labs (all labs ordered are listed, but only abnormal results are displayed) Labs Reviewed  BASIC METABOLIC PANEL - Abnormal; Notable for the following components:      Result Value   Glucose, Bld 118 (*)    All other components within normal limits  CBG MONITORING, ED - Abnormal; Notable  for the following components:   Glucose-Capillary 45 (*)    All other components within normal limits  CBG MONITORING, ED - Abnormal; Notable for the following components:   Glucose-Capillary 106 (*)    All other components within normal limits  CBG MONITORING, ED - Abnormal; Notable for the following components:   Glucose-Capillary 125 (*)    All other components within normal limits  SARS CORONAVIRUS 2 (HOSPITAL ORDER, PERFORMED IN [facility])  CBC WITH DIFFERENTIAL/PLATELET  CBC WITH DIFFERENTIAL/PLATELET  TROPONIN I (HIGH SENSITIVITY)  TROPONIN I (HIGH SENSITIVITY)    EKG EKG Interpretation  Date/Time:  Sunday March 24 2019 11:41:40 EDT Ventricular Rate:  69 PR Interval:  130 QRS Duration: 106 QT Interval:  390 QTC Calculation: 417 R Axis:   68 Text Interpretation:  Normal sinus rhythm Normal ECG No previous tracing Confirmed by Gwyneth SproutPlunkett, Whitney ([id number]) on 03/24/2019 12:03:22 PM   Radiology Dg Chest 2 View  Result Date: 03/24/2019 CLINICAL DATA:  Syncopal episode.  Chest pain. EXAM: CHEST - 2 VIEW COMPARISON:  06/19/2017 FINDINGS: Midline trachea. Normal heart size and mediastinal contours. Numerous leads and wires project over the chest. No pleural effusion or pneumothorax. Clear lungs. Probable nipple shadow projecting over the left lung base; CTA of the chest is pending. IMPRESSION: No acute cardiopulmonary disease.  Electronically Signed   By: Jeronimo GreavesKyle  Talbot M.D.   On: 03/24/2019 14:10   Ct Angio Chest/abd/pel For Dissection W And/or Wo Contrast  Result Date: 03/24/2019 CLINICAL DATA:  Chest pain.  Right hand tingling. EXAM: CT ANGIOGRAPHY CHEST, ABDOMEN AND PELVIS TECHNIQUE: Multidetector CT imaging through the chest, abdomen and pelvis was performed using the standard protocol during bolus administration of intravenous contrast. Multiplanar reconstructed images and MIPs were obtained and reviewed to evaluate the vascular anatomy. CONTRAST:  100mL OMNIPAQUE IOHEXOL 350 MG/ML SOLN COMPARISON:  CT the abdomen and pelvis January 23, 2015. CT of the chest February 22, 2012. FINDINGS: CTA CHEST FINDINGS Cardiovascular: Coronary artery calcifications are seen in the left main and anterior descending coronary arteries. Probable mild coronary artery calcifications in the right coronary artery. The heart is unremarkable. Central pulmonary arteries are normal in caliber with no filling defects identified. Minimal atherosclerotic changes in the thoracic aorta. The ascending thoracic aorta measures up to 4.2 cm just proximal to the arch. This is not significantly changed, measuring 4.1 cm previously. The remainder of the thoracic aorta is normal in caliber with no other evidence of aneurysm. No dissection. The left subclavian artery is normal in appearance. The left carotid artery, right carotid artery, and right subclavian artery arise from a common trunk. There is mild atherosclerotic change in the left common carotid artery seen on image 1. The right common carotid artery is normal in appearance. Visualized portions of the right subclavian artery are normal in appearance. A portion is obscured by streak artifact off the right subclavian vein which is contrast filled. Visualized portions of the vertebral arteries are normal. Mediastinum/Nodes: No enlarged mediastinal, hilar, or axillary lymph nodes. Thyroid gland, trachea, and esophagus  demonstrate no significant findings. Lungs/Pleura: A nodular density in the right lung on series 6, image 34 is unchanged since 2013, not requiring follow-up. No other abnormalities in the lungs. The central airways are normal. No pneumothorax. Musculoskeletal: See below. Review of the MIP images confirms the above findings. CTA ABDOMEN AND PELVIS FINDINGS VASCULAR Aorta: Mild atherosclerotic changes seen in the nonaneurysmal abdominal aorta. No dissection. Celiac: Patent without evidence of aneurysm, dissection, vasculitis  or significant stenosis. SMA: Patent without evidence of aneurysm, dissection, vasculitis or significant stenosis. Renals: Both renal arteries are patent without evidence of aneurysm, dissection, vasculitis, fibromuscular dysplasia or significant stenosis. IMA: Patent without evidence of aneurysm, dissection, vasculitis or significant stenosis. Iliac and femoral arteries: The iliac and femoral arteries are widely patent and well opacified with no aneurysm or dissection. Minimal atherosclerotic changes. Inflow: Patent without evidence of aneurysm, dissection, vasculitis or significant stenosis. Veins: No obvious venous abnormality within the limitations of this arterial phase study. Review of the MIP images confirms the above findings. NON-VASCULAR Hepatobiliary: Evaluation is limited due to arterial phase imaging. Within this limitation, no abnormalities are identified. The patient is status post cholecystectomy. Pancreas: Unremarkable. No pancreatic ductal dilatation or surrounding inflammatory changes. Spleen: Normal in size without focal abnormality. Adrenals/Urinary Tract: Bilateral renal stones are identified. The largest stone is seen on the right measuring up to 13 mm. No hydronephrosis or perinephric stranding identified. The ureters and bladder are normal. Stomach/Bowel: A few jejunal loops appear somewhat thick walled such as on axial image 181 of series 7. No adjacent stranding. No  obstruction identified. The remainder of the small bowel is normal. There is moderate fecal loading in the colon. The colon is otherwise normal. The appendix is not seen but there is no secondary evidence of appendicitis. Lymphatic: No adenopathy is identified. Reproductive: Prostate is unremarkable. Other: There is a fat containing right inguinal hernia which is small. There is a small periumbilical fat containing hernia hernia. No free air free fluid. Musculoskeletal: Mild anterior wedging of T12 is stable. No other bony abnormalities are identified. Probable previous sternal fracture. Review of the MIP images confirms the above findings. IMPRESSION: 1. 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 No dissection in the thoracic or abdominal aorta. Mild atherosclerotic changes in the thoracic and abdominal aorta. No cause for the patient's left arm symptoms identified. Recommend annual imaging followup by CTA or MRA. This recommendation follows 2010 ACCF/AHA/AATS/ACR/ASA/SCA/SCAI/SIR/STS/SVM Guidelines for the Diagnosis and Management of Patients with Thoracic Aortic Disease. Circulation. 2010; 121: N829-F621: E266-e369. Aortic aneurysm NOS (ICD10-I71.9) 2. Coronary artery calcifications most prominent in the left main and LAD. There appears to be more mild involvement of the right coronary artery. 3. Nonobstructive renal stones. A 13 mm stone is seen in the lower pole the right kidney. 4. There are a few jejunal loops which demonstrate prominence of the wall and possible thickening. This could be due to the lack of distention. In the appropriate clinical setting, enteritis could have a similar appearance. Recommend clinical correlation. 5. Small fat containing right inguinal hernia. 6. Chronic compression fracture of T12, mild. Electronically Signed   By: Gerome Samavid  Williams III M.D   On: 03/24/2019 15:07    Procedures Procedures (including critical care time)  Medications  Ordered in ED Medications  iohexol (OMNIPAQUE) 350 MG/ML injection 100 mL (100 mLs Intravenous Contrast Given 03/24/19 1357)     Initial Impression / Assessment and Plan / ED Course  I have reviewed the triage vital signs and the nursing notes.  Pertinent labs & imaging results that were available during my care of the patient were reviewed by me and considered in my medical decision making (see chart for details).       Patrick Curry is a 58 y.o. male who presents to ED for central chest pain earlier this morning followed by syncopal episode. Upon arrival to ED, he is afebrile with normal cardiopulmonary examination.  100% O2 on RA. RRR. EKG NSR. No acute ischemic changes or arrhythmia. Troponin of 2. Initial CBG of 45 - given something to drink and CBG improved. CXR negative. CT angio obtained to rule out dissection as he had sharp chest pain, syncope with right hand numbness upon regaining consciousness. CT angio shows no dissection, but does note mild ascending thoracic aneurysm.  Also notes a few jejunal loops which demonstrate prominence of the bowel and possible thickening.  Per radiology, could be enteritis or lack of distention.  Patient is experiencing no abdominal pain, nausea, vomiting or diarrhea.  He has no abdominal tenderness.  Doubt enteritis. Possible hypoglycemia contributed to syncopal event, but given sharp chest pain just prior to syncopal episode with his PMH, feel he needs to be admitted for serial troponins and obs syncope workup given possible cardiac etiology. Hospitalist consulted who will admit.   Patient discussed with Dr. Anitra Lauth who agrees with treatment plan.    Final Clinical Impressions(s) / ED Diagnoses   Final diagnoses:  Chest pain  Syncope and collapse    ED Discharge Orders    None       Jai Steil, Chase Picket, PA-C 03/24/19 1547    Gwyneth Sprout, MD 03/24/19 2129

## 2019-03-24 NOTE — ED Triage Notes (Signed)
Patient arrived via EMS  Patient reported having chest pain 1 hour before EMS arrived with sudden dizziness soon after that. Patient sat down and friends helped him to the floor.   Firemen came and when they saw him he was unconscious, breathing about 6x a minute. They gave 4 rescue breaths and he regained consciousness.   Chest pain increased upon inspection and inspiration. No SOB, numbness in the right arm.   A&Ox4

## 2019-03-25 ENCOUNTER — Encounter (HOSPITAL_COMMUNITY)
Admission: EM | Disposition: A | Discharge status: Home / Self Care | Payer: Self-pay | Source: Home / Self Care | Attending: Emergency Medicine

## 2019-03-25 ENCOUNTER — Observation Stay (HOSPITAL_BASED_OUTPATIENT_CLINIC_OR_DEPARTMENT_OTHER): Payer: BC Managed Care – PPO

## 2019-03-25 ENCOUNTER — Encounter (HOSPITAL_COMMUNITY): Payer: Self-pay | Admitting: Internal Medicine

## 2019-03-25 DIAGNOSIS — R0789 Other chest pain: Secondary | ICD-10-CM | POA: Diagnosis not present

## 2019-03-25 DIAGNOSIS — R55 Syncope and collapse: Secondary | ICD-10-CM | POA: Diagnosis not present

## 2019-03-25 DIAGNOSIS — I714 Abdominal aortic aneurysm, without rupture: Secondary | ICD-10-CM | POA: Diagnosis not present

## 2019-03-25 DIAGNOSIS — I2510 Atherosclerotic heart disease of native coronary artery without angina pectoris: Secondary | ICD-10-CM | POA: Diagnosis not present

## 2019-03-25 DIAGNOSIS — R079 Chest pain, unspecified: Secondary | ICD-10-CM | POA: Diagnosis not present

## 2019-03-25 DIAGNOSIS — Z8673 Personal history of transient ischemic attack (TIA), and cerebral infarction without residual deficits: Secondary | ICD-10-CM | POA: Diagnosis not present

## 2019-03-25 HISTORY — PX: LEFT HEART CATH AND CORONARY ANGIOGRAPHY: CATH118249

## 2019-03-25 LAB — LIPID PANEL
Cholesterol: 110 mg/dL (ref 0–200)
HDL: 48 mg/dL (ref >40)
LDL Cholesterol: 43 mg/dL (ref 0–99)
Total CHOL/HDL Ratio: 2.3 RATIO
Triglycerides: 96 mg/dL (ref <150)
VLDL: 19 mg/dL (ref 0–40)

## 2019-03-25 LAB — ECHOCARDIOGRAM COMPLETE
Height: 72 in
Weight: 2931.2 oz

## 2019-03-25 LAB — GLUCOSE, CAPILLARY
Glucose-Capillary: 130 mg/dL — ABNORMAL HIGH (ref 70–99)
Glucose-Capillary: 165 mg/dL — ABNORMAL HIGH (ref 70–99)
Glucose-Capillary: 188 mg/dL — ABNORMAL HIGH (ref 70–99)
Glucose-Capillary: 260 mg/dL — ABNORMAL HIGH (ref 70–99)
Glucose-Capillary: 279 mg/dL — ABNORMAL HIGH (ref 70–99)
Glucose-Capillary: 339 mg/dL — ABNORMAL HIGH (ref 70–99)

## 2019-03-25 LAB — PROTIME-INR
INR: 1.2 (ref 0.8–1.2)
Prothrombin Time: 14.7 seconds (ref 11.4–15.2)

## 2019-03-25 LAB — HEMOGLOBIN A1C
Hgb A1c MFr Bld: 7.2 % — ABNORMAL HIGH (ref 4.8–5.6)
Mean Plasma Glucose: 159.94 mg/dL

## 2019-03-25 LAB — TSH: TSH: 1.15 u[IU]/mL (ref 0.350–4.500)

## 2019-03-25 LAB — HIV ANTIBODY (ROUTINE TESTING W REFLEX): HIV Screen 4th Generation wRfx: NONREACTIVE

## 2019-03-25 SURGERY — LEFT HEART CATH AND CORONARY ANGIOGRAPHY
Anesthesia: LOCAL

## 2019-03-25 MED ORDER — SODIUM CHLORIDE 0.9% FLUSH
3.0000 mL | Freq: Two times a day (BID) | INTRAVENOUS | Status: DC
  Administered 2019-03-26: 3 mL via INTRAVENOUS

## 2019-03-25 MED ORDER — IOHEXOL 350 MG/ML SOLN
INTRAVENOUS | Status: DC | PRN
  Administered 2019-03-25: 14:00:00 40 mL via INTRA_ARTERIAL

## 2019-03-25 MED ORDER — HEPARIN (PORCINE) IN NACL 1000-0.9 UT/500ML-% IV SOLN
INTRAVENOUS | Status: AC
  Filled 2019-03-25: qty 1000

## 2019-03-25 MED ORDER — FENTANYL CITRATE (PF) 100 MCG/2ML IJ SOLN
INTRAMUSCULAR | Status: DC | PRN
  Administered 2019-03-25: 25 ug via INTRAVENOUS

## 2019-03-25 MED ORDER — VERAPAMIL HCL 2.5 MG/ML IV SOLN
INTRAVENOUS | Status: DC | PRN
  Administered 2019-03-25: 14:00:00 10 mL via INTRA_ARTERIAL

## 2019-03-25 MED ORDER — VERAPAMIL HCL 2.5 MG/ML IV SOLN
INTRAVENOUS | Status: AC
  Filled 2019-03-25: qty 2

## 2019-03-25 MED ORDER — ENOXAPARIN SODIUM 40 MG/0.4ML ~~LOC~~ SOLN
40.0000 mg | SUBCUTANEOUS | Status: DC
  Administered 2019-03-26: 40 mg via SUBCUTANEOUS
  Filled 2019-03-25: qty 0.4

## 2019-03-25 MED ORDER — LIDOCAINE HCL (PF) 1 % IJ SOLN
INTRAMUSCULAR | Status: AC
  Filled 2019-03-25: qty 30

## 2019-03-25 MED ORDER — MIDAZOLAM HCL 2 MG/2ML IJ SOLN
INTRAMUSCULAR | Status: AC
  Filled 2019-03-25: qty 2

## 2019-03-25 MED ORDER — SODIUM CHLORIDE 0.9% FLUSH: 3.0000 mL | Freq: Two times a day (BID) | INTRAVENOUS | Status: DC

## 2019-03-25 MED ORDER — MAGNESIUM HYDROXIDE 400 MG/5ML PO SUSP
30.0000 mL | Freq: Once | ORAL | Status: AC
  Administered 2019-03-25: 30 mL via ORAL
  Filled 2019-03-25: qty 30

## 2019-03-25 MED ORDER — FENTANYL CITRATE (PF) 100 MCG/2ML IJ SOLN
INTRAMUSCULAR | Status: AC
  Filled 2019-03-25: qty 2

## 2019-03-25 MED ORDER — INSULIN ASPART 100 UNIT/ML ~~LOC~~ SOLN
0.0000 [IU] | Freq: Three times a day (TID) | SUBCUTANEOUS | Status: DC
  Administered 2019-03-25: 2 [IU] via SUBCUTANEOUS

## 2019-03-25 MED ORDER — SODIUM CHLORIDE 0.9 % IV SOLN: 250.0000 mL | INTRAVENOUS | Status: DC | PRN

## 2019-03-25 MED ORDER — HYDRALAZINE HCL 20 MG/ML IJ SOLN: 10.0000 mg | INTRAMUSCULAR | Status: AC | PRN

## 2019-03-25 MED ORDER — LIDOCAINE HCL (PF) 1 % IJ SOLN
INTRAMUSCULAR | Status: DC | PRN
  Administered 2019-03-25: 2 mL

## 2019-03-25 MED ORDER — LABETALOL HCL 5 MG/ML IV SOLN: 10.0000 mg | INTRAVENOUS | Status: AC | PRN

## 2019-03-25 MED ORDER — SODIUM CHLORIDE 0.9 % WEIGHT BASED INFUSION
1.0000 mL/kg/h | INTRAVENOUS | Status: DC
  Administered 2019-03-25: 1 mL/kg/h via INTRAVENOUS

## 2019-03-25 MED ORDER — MIDAZOLAM HCL 2 MG/2ML IJ SOLN
INTRAMUSCULAR | Status: DC | PRN
  Administered 2019-03-25: 1 mg via INTRAVENOUS

## 2019-03-25 MED ORDER — SODIUM CHLORIDE 0.9% FLUSH: 3.0000 mL | INTRAVENOUS | Status: DC | PRN

## 2019-03-25 MED ORDER — SODIUM CHLORIDE 0.9 % IV SOLN: INTRAVENOUS | Status: AC

## 2019-03-25 MED ORDER — ASPIRIN 81 MG PO CHEW
81.0000 mg | CHEWABLE_TABLET | ORAL | Status: AC
  Administered 2019-03-25: 81 mg via ORAL
  Filled 2019-03-25: qty 1

## 2019-03-25 MED ORDER — INSULIN ASPART 100 UNIT/ML ~~LOC~~ SOLN
0.0000 [IU] | SUBCUTANEOUS | Status: DC
  Administered 2019-03-25: 5 [IU] via SUBCUTANEOUS
  Administered 2019-03-26: 2 [IU] via SUBCUTANEOUS
  Administered 2019-03-26: 3 [IU] via SUBCUTANEOUS
  Administered 2019-03-26: 2 [IU] via SUBCUTANEOUS

## 2019-03-25 MED ORDER — HEPARIN (PORCINE) IN NACL 1000-0.9 UT/500ML-% IV SOLN
INTRAVENOUS | Status: DC | PRN
  Administered 2019-03-25 (×2): 500 mL

## 2019-03-25 MED ORDER — NITROGLYCERIN 1 MG/10 ML FOR IR/CATH LAB
INTRA_ARTERIAL | Status: DC | PRN
  Administered 2019-03-25: 200 ug

## 2019-03-25 MED ORDER — NITROGLYCERIN 1 MG/10 ML FOR IR/CATH LAB
INTRA_ARTERIAL | Status: AC
  Filled 2019-03-25: qty 10

## 2019-03-25 MED ORDER — HEPARIN SODIUM (PORCINE) 1000 UNIT/ML IJ SOLN
INTRAMUSCULAR | Status: DC | PRN
  Administered 2019-03-25: 4000 [IU] via INTRAVENOUS

## 2019-03-25 MED ORDER — SODIUM CHLORIDE 0.9 % WEIGHT BASED INFUSION
3.0000 mL/kg/h | INTRAVENOUS | Status: DC
  Administered 2019-03-25: 12:00:00 3 mL/kg/h via INTRAVENOUS

## 2019-03-25 SURGICAL SUPPLY — 9 items
CATH OPTITORQUE TIG 4.0 5F (CATHETERS) ×2 IMPLANT
DEVICE RAD COMP TR BAND LRG (VASCULAR PRODUCTS) ×2 IMPLANT
GUIDEWIRE INQWIRE 1.5J.035X260 (WIRE) ×1 IMPLANT
INQWIRE 1.5J .035X260CM (WIRE) ×2
KIT HEART LEFT (KITS) ×2 IMPLANT
PACK CARDIAC CATHETERIZATION (CUSTOM PROCEDURE TRAY) ×2 IMPLANT
SHEATH RAIN RADIAL 21G 6FR (SHEATH) ×2 IMPLANT
TRANSDUCER W/STOPCOCK (MISCELLANEOUS) ×2 IMPLANT
TUBING CIL FLEX 10 FLL-RA (TUBING) ×2 IMPLANT

## 2019-03-25 NOTE — Interval H&P Note (Signed)
History and Physical Interval Note:  03/25/2019 1:44 PM  Patrick Curry  has presented today for cardiac catheterization, with the diagnosis of chest pain and syncope.  The various methods of treatment have been discussed with the patient and family. After consideration of risks, benefits and other options for treatment, the patient has consented to  Procedure(s): LEFT HEART CATH AND CORONARY ANGIOGRAPHY (N/A) as a surgical intervention.  The patient's history has been reviewed, patient examined, no change in status, stable for surgery.  I have reviewed the patient's chart and labs.  Questions were answered to the patient's satisfaction.    Cath Lab Visit (complete for each Cath Lab visit)  Clinical Evaluation Leading to the Procedure:   ACS: Yes.    Non-ACS:  N/A  Elaine [place]

## 2019-03-25 NOTE — Plan of Care (Signed)
  Problem: Clinical Measurements: Goal: Will remain free from infection Outcome: Progressing   Problem: Activity: Goal: Risk for activity intolerance will decrease Outcome: Progressing   Problem: Coping: Goal: Level of anxiety will decrease Outcome: Progressing   Problem: Safety: Goal: Ability to remain free from injury will improve Outcome: Progressing   

## 2019-03-25 NOTE — Progress Notes (Signed)
[facility] PROGRESS NOTE  Patrick Curry LDJ:[id number] DOB: 1961/02/14 DOA: 03/24/2019 PCP: Guadalupe Maple., MD  Assessment/Plan:  Atypical chest pain, syncope: Acute.  Patient presented with complaints of right-sided chest pain arm numbness.  Subsequently, reported to have had a syncopal event.  CT angiogram negative for any signs of pulmonary embolus or dissection, but did show more calcifications in the left main, LAD, and mild more involvement in the right coronary artery. Not orthostatic. Evaluated by cards who opine pain is atypical sx with negative trop and abnormal EF. Recommending cath. UDS negative.  -follow cath result -appreciate Cardiology consult  Coronary artery disease: Patient had negative cardiac perfusion study performed June 2018 with mild coronary artery disease noted, and was evaluated by Dr. Dulce Sellar at that time.  CTA imaging from revealed  progression of disease. -see #1   Diabetes mellitus type 2 with hypoglycemia: Patient noted to have low blood sugars 45 on admission. A1c 7.2. reports he took meds that morning and did not eat.  -Hypoglycemic protocols -Holding metformin and Januvia -Decreased Lantus from 70 to 40 units for possible need of procedure -CBGs q. before meals and at bedtime with sensitive SSI  History of CVA: Patient reports pain status post TPA and has no residual deficits. -Continue Plavix  AAA: 4.2 ascending thoracic aortic aneurysm noted on CTA that appears unchanged since 2013.  Essential hypertension. Fair control -Continue Coreg  Nephrolithiasis: Patient noted to have nonobstructing stones on CT angiogram.  One noted to be 13 mm in the lower right pole.  Hyperlipidemia. Lipid panel unremarkable.  -Continue atorvastatin   Code Status: full Family Communication: patient updated Disposition Plan: home in am hopefully   Consultants:  [facility]  Procedures:  Cath 8/10  Echo  8/10  Antibiotics:    HPI/Subjective: Lying in bed watching TV. Denies pain/discomfort  Objective: Vitals:   03/25/19 1137 03/25/19 1341  BP: (!) 143/72   Pulse: 82   Resp: 16   Temp: 98.4 F (36.9 C)   SpO2: 97% 99%    Intake/Output Summary (Last 24 hours) at 03/25/2019 1404 Last data filed at 03/25/2019 0839 Gross per 24 hour  Intake 480 ml  Output 3575 ml  Net -3095 ml   Filed Weights   03/24/19 1846 03/25/19 0412  Weight: 82.7 kg 83.1 kg    Exam:   General:  Awake alert no acute distress  Cardiovascular: rrr no mgr no LE edema  Respiratory: normal effort BS clear bilaterally no wheeze  Abdomen: non-distended non-tender +BS no guarding or rebounding  Musculoskeletal: joints without swelling/erythema   Data Reviewed: Basic Metabolic Panel: Recent Labs  Lab 03/24/19 1223  NA 140  K 3.9  CL 106  CO2 23  GLUCOSE 118*  BUN 14  CREATININE 1.01  CALCIUM 9.2   Liver Function Tests: No results for input(s): AST, ALT, ALKPHOS, BILITOT, PROT, ALBUMIN in the last 168 hours. No results for input(s): LIPASE, AMYLASE in the last 168 hours. No results for input(s): AMMONIA in the last 168 hours. CBC: Recent Labs  Lab 03/24/19 1330  WBC 6.0  NEUTROABS 3.3  HGB 14.3  HCT 40.5  MCV 83.9  PLT 242   Cardiac Enzymes: No results for input(s): CKTOTAL, CKMB, CKMBINDEX, TROPONINI in the last 168 hours. BNP (last 3 results) No results for input(s): BNP in the last 8760 hours.  ProBNP (last 3 results) No results for input(s): PROBNP in the last 8760 hours.  CBG: Recent Labs  Lab 03/24/19 2047 03/25/19 0012  03/25/19 0402 03/25/19 0836 03/25/19 1133  GLUCAP 263* 339* 260* 188* 130*    Recent Results (from the past 240 hour(s))  SARS Coronavirus 2 [facility](Hospital order, Performed in [facility] hospital lab) Nasopharyngeal Nasopharyngeal Swab     Status: None   Collection Time: 03/24/19  4:38 PM   Specimen: Nasopharyngeal Swab  Result Value Ref Range  Status   SARS Coronavirus 2 NEGATIVE NEGATIVE Final    Comment: (NOTE) If result is NEGATIVE SARS-CoV-2 target nucleic acids are NOT DETECTED. The SARS-CoV-2 RNA is generally detectable in upper and lower  respiratory specimens during the acute phase of infection. The lowest  concentration of SARS-CoV-2 viral copies this assay can detect is 250  copies / mL. A negative result does not preclude SARS-CoV-2 infection  and should not be used as the sole basis for treatment or other  patient management decisions.  A negative result may occur with  improper specimen collection / handling, submission of specimen other  than nasopharyngeal swab, presence of viral mutation(s) within the  areas targeted by this assay, and inadequate number of viral copies  (<250 copies / mL). A negative result must be combined with clinical  observations, patient history, and epidemiological information. If result is POSITIVE SARS-CoV-2 target nucleic acids are DETECTED. The SARS-CoV-2 RNA is generally detectable in upper and lower  respiratory specimens dur ing the acute phase of infection.  Positive  results are indicative of active infection with SARS-CoV-2.  Clinical  correlation with patient history and other diagnostic information is  necessary to determine patient infection status.  Positive results do  not rule out bacterial infection or co-infection with other viruses. If result is PRESUMPTIVE POSTIVE SARS-CoV-2 nucleic acids MAY BE PRESENT.   A presumptive positive result was obtained on the submitted specimen  and confirmed on repeat testing.  While 2019 novel coronavirus  (SARS-CoV-2) nucleic acids may be present in the submitted sample  additional confirmatory testing may be necessary for epidemiological  and / or clinical management purposes  to differentiate between  SARS-CoV-2 and other Sarbecovirus currently known to infect humans.  If clinically indicated additional testing with an alternate  test  methodology [phone]([id number]) is advised. The SARS-CoV-2 RNA is generally  detectable in upper and lower respiratory sp ecimens during the acute  phase of infection. The expected result is Negative. Fact Sheet for Patients:  BoilerBrush.com.cyhttps://www.fda.gov/media/136312/download Fact Sheet for Healthcare Providers: https://pope.com/https://www.fda.gov/media/136313/download This test is not yet approved or cleared by the [place] FDA and has been authorized for detection and/or diagnosis of SARS-CoV-2 by FDA under an Emergency Use Authorization (EUA).  This EUA will remain in effect (meaning this test can be used) for the duration of the COVID-19 declaration under Section 564(b)(1) of the Act, 21 U.S.C. section 360bbb-3(b)(1), unless the authorization is terminated or revoked sooner. Performed at [facility], [address] [place], [place] [address]      Studies: Dg Chest 2 View  Result Date: 03/24/2019 CLINICAL DATA:  Syncopal episode.  Chest pain. EXAM: CHEST - 2 VIEW COMPARISON:  06/19/2017 FINDINGS: Midline trachea. Normal heart size and mediastinal contours. Numerous leads and wires project over the chest. No pleural effusion or pneumothorax. Clear lungs. Probable nipple shadow projecting over the left lung base; CTA of the chest is pending. IMPRESSION: No acute cardiopulmonary disease. Electronically Signed   By: Jeronimo GreavesKyle  Talbot M.D.   On: 03/24/2019 14:10   Ct Angio Chest/abd/pel For Dissection W And/or Wo Contrast  Result Date: 03/24/2019 CLINICAL  DATA:  Chest pain.  Right hand tingling. EXAM: CT ANGIOGRAPHY CHEST, ABDOMEN AND PELVIS TECHNIQUE: Multidetector CT imaging through the chest, abdomen and pelvis was performed using the standard protocol during bolus administration of intravenous contrast. Multiplanar reconstructed images and MIPs were obtained and reviewed to evaluate the vascular anatomy. CONTRAST:  100mL OMNIPAQUE IOHEXOL 350 MG/ML SOLN COMPARISON:  CT the abdomen and pelvis January 23, 2015.  CT of the chest February 22, 2012. FINDINGS: CTA CHEST FINDINGS Cardiovascular: Coronary artery calcifications are seen in the left main and anterior descending coronary arteries. Probable mild coronary artery calcifications in the right coronary artery. The heart is unremarkable. Central pulmonary arteries are normal in caliber with no filling defects identified. Minimal atherosclerotic changes in the thoracic aorta. The ascending thoracic aorta measures up to 4.2 cm just proximal to the arch. This is not significantly changed, measuring 4.1 cm previously. The remainder of the thoracic aorta is normal in caliber with no other evidence of aneurysm. No dissection. The left subclavian artery is normal in appearance. The left carotid artery, right carotid artery, and right subclavian artery arise from a common trunk. There is mild atherosclerotic change in the left common carotid artery seen on image 1. The right common carotid artery is normal in appearance. Visualized portions of the right subclavian artery are normal in appearance. A portion is obscured by streak artifact off the right subclavian vein which is contrast filled. Visualized portions of the vertebral arteries are normal. Mediastinum/Nodes: No enlarged mediastinal, hilar, or axillary lymph nodes. Thyroid gland, trachea, and esophagus demonstrate no significant findings. Lungs/Pleura: A nodular density in the right lung on series 6, image 34 is unchanged since 2013, not requiring follow-up. No other abnormalities in the lungs. The central airways are normal. No pneumothorax. Musculoskeletal: See below. Review of the MIP images confirms the above findings. CTA ABDOMEN AND PELVIS FINDINGS VASCULAR Aorta: Mild atherosclerotic changes seen in the nonaneurysmal abdominal aorta. No dissection. Celiac: Patent without evidence of aneurysm, dissection, vasculitis or significant stenosis. SMA: Patent without evidence of aneurysm, dissection, vasculitis or significant  stenosis. Renals: Both renal arteries are patent without evidence of aneurysm, dissection, vasculitis, fibromuscular dysplasia or significant stenosis. IMA: Patent without evidence of aneurysm, dissection, vasculitis or significant stenosis. Iliac and femoral arteries: The iliac and femoral arteries are widely patent and well opacified with no aneurysm or dissection. Minimal atherosclerotic changes. Inflow: Patent without evidence of aneurysm, dissection, vasculitis or significant stenosis. Veins: No obvious venous abnormality within the limitations of this arterial phase study. Review of the MIP images confirms the above findings. NON-VASCULAR Hepatobiliary: Evaluation is limited due to arterial phase imaging. Within this limitation, no abnormalities are identified. The patient is status post cholecystectomy. Pancreas: Unremarkable. No pancreatic ductal dilatation or surrounding inflammatory changes. Spleen: Normal in size without focal abnormality. Adrenals/Urinary Tract: Bilateral renal stones are identified. The largest stone is seen on the right measuring up to 13 mm. No hydronephrosis or perinephric stranding identified. The ureters and bladder are normal. Stomach/Bowel: A few jejunal loops appear somewhat thick walled such as on axial image 181 of series 7. No adjacent stranding. No obstruction identified. The remainder of the small bowel is normal. There is moderate fecal loading in the colon. The colon is otherwise normal. The appendix is not seen but there is no secondary evidence of appendicitis. Lymphatic: No adenopathy is identified. Reproductive: Prostate is unremarkable. Other: There is a fat containing right inguinal hernia which is small. There is a small periumbilical fat containing hernia  hernia. No free air free fluid. Musculoskeletal: Mild anterior wedging of T12 is stable. No other bony abnormalities are identified. Probable previous sternal fracture. Review of the MIP images confirms the above  findings. IMPRESSION: 1. 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 No dissection in the thoracic or abdominal aorta. Mild atherosclerotic changes in the thoracic and abdominal aorta. No cause for the patient's left arm symptoms identified. Recommend annual imaging followup by CTA or MRA. This recommendation follows 2010 ACCF/AHA/AATS/ACR/ASA/SCA/SCAI/SIR/STS/SVM Guidelines for the Diagnosis and Management of Patients with Thoracic Aortic Disease. Circulation. 2010; 121: H607-P710. Aortic aneurysm NOS (ICD10-I71.9) 2. Coronary artery calcifications most prominent in the left main and LAD. There appears to be more mild involvement of the right coronary artery. 3. Nonobstructive renal stones. A 13 mm stone is seen in the lower pole the right kidney. 4. There are a few jejunal loops which demonstrate prominence of the wall and possible thickening. This could be due to the lack of distention. In the appropriate clinical setting, enteritis could have a similar appearance. Recommend clinical correlation. 5. Small fat containing right inguinal hernia. 6. Chronic compression fracture of T12, mild. Electronically Signed   By: Dorise Bullion III M.D   On: 03/24/2019 15:07    Scheduled Meds: . [MAR Hold] atorvastatin  40 mg Oral Daily  . [MAR Hold] carvedilol  6.25 mg Oral BID WC  . [MAR Hold] clopidogrel  75 mg Oral Daily  . [MAR Hold] enoxaparin (LOVENOX) injection  40 mg Subcutaneous Q24H  . [MAR Hold] folic acid  1 mg Oral Daily  . [MAR Hold] montelukast  10 mg Oral Daily  . sodium chloride flush  3 mL Intravenous Q12H  . [MAR Hold] sulfaSALAzine  1,500 mg Oral BID   Continuous Infusions: . sodium chloride    . sodium chloride 1 mL/kg/hr (03/25/19 1318)  . dextrose Stopped (03/25/19 0025)    Principal Problem:   Chest pain Active Problems:   CAD in native artery   Syncope and collapse   Type 2 diabetes mellitus without complication (HCC)   AAA  (abdominal aortic aneurysm) (HCC)   History of CVA (cerebrovascular accident)    Time spent: 47 minutes    [place] NP  [facility]  If 7PM-7AM, please contact night-coverage at [email or web address], password [facility] 03/25/2019, 2:04 PM  LOS: 0 days

## 2019-03-25 NOTE — Progress Notes (Signed)
Inpatient Diabetes Program Recommendations  AACE/ADA: New Consensus Statement on Inpatient Glycemic Control   Target Ranges:  Prepandial:   less than 140 mg/dL      Peak postprandial:   less than 180 mg/dL (1-2 hours)      Critically ill patients:  140 - 180 mg/dL   Results for BELLAMY, JUDSON (MRN [id number]) as of 03/25/2019 11:52  Ref. Range 03/24/2019 12:00 03/24/2019 12:16 03/24/2019 12:36 03/24/2019 18:56 03/24/2019 19:27 03/24/2019 20:33 03/24/2019 20:47 03/25/2019 00:12 03/25/2019 04:02 03/25/2019 08:36 03/25/2019 11:33  Glucose-Capillary Latest Ref Range: 70 - 99 mg/dL 45 (L) 106 (H) 125 (H) 33 (LL) 81 236 (H) 263 (H) 339 (H) 260 (H) 188 (H) 130 (H)   Review of Glycemic Control  Diabetes history: DM2 Outpatient Diabetes medications: Lantus 70 units QHS, Metformin 1000 mg BID, Januvia 100 mg daily Current orders for Inpatient glycemic control: CBGs Q4H  Inpatient Diabetes Program Recommendations:   IV fluids: Noted D5@75  ml/hr ordered; please re-evaluate if dextrose is still needed once patient is tolerating PO intake.   Correction (SSI): Noted patient was initially hypoglycemic which has resolved. Please consider ordering Novolog 0-9 units Q4H.  HgbA1C: A1C in process.  NOTE: Noted patient was initially hypoglycemic with glucose of 45 mg/dl. CBGs order Q4H. Glucose up to 339 mg/dl at 00:12 today and 130 mg/dl at 11:33 today without receiving any insulin since being admitted. Recommend ordering sensitive Novolog correction scale.  If glucose becomes consistently elevated with Novolog correction, will likely need basal insulin ordered.   Thanks, Barnie Alderman, RN, MSN, CDE Diabetes Coordinator Inpatient Diabetes Program [phone] (Team Pager from 8am to 5pm)

## 2019-03-25 NOTE — Plan of Care (Signed)
  Problem: Education: Goal: Knowledge of General Education information will improve Description Including pain rating scale, medication(s)/side effects and non-pharmacologic comfort measures Outcome: Progressing   

## 2019-03-25 NOTE — Progress Notes (Signed)
Received pt. From Cathlab. Right radial site, level 0, pt able to move fingers. Will monitor.

## 2019-03-25 NOTE — H&P (View-Only) (Signed)
Cardiology Consultation:   Patient ID: Patrick Curry; [id number]; 10-05-1960   Admit date: 03/24/2019 Date of Consult: 03/25/2019  Primary Care Provider: Ocie Doyne., MD Primary Cardiologist: Shirlee More, MD 07/20/2017 Primary Electrophysiologist:  None   Patient Profile:   Patrick Curry is a 58 y.o. male with a hx of CP w/ mild dz on cardiac CT 2018, DM, HTN, HLD, CVA, IBD, DVT, asthma, carotid dz who is being seen today for the evaluation of CP and syncope at the request of Dr Tamala Julian.  History of Present Illness:   Patrick. Curry was last seen by cardiology in 2018, was to f/u prn.   He was admitted 08/09 after prolonged syncope w/ significant prodrome, CBG 45 on arrival to [facility]. Echo was abnl, cards asked to see.   Pt says boss told him CBG was 122 right after he passed out per firemen. CBG 45 on admission. Came up to 125 but dropped to 33 overnight, rx w/ IVF. No recent changes in regimen, Lantus 70 U qam, glucophage 1000 mg bid, Januvia 100 mg qd. HgbA1c usually in the 6-7 range  Patrick Curry was at work when he developed sx. Works a 12 hr shift, 4 days a week. He had eaten breakfast and taken meds like normal. He felt normal when he got up, CBG 92.  Work was normal, no more strenuous than usual. About 8:30, he got CP, just to the L of the upper sternum. Initially felt like needles, then got sharp. Does not remember if it was worse with deep inspiration. Does not remember being SOB or nauseated. Was diaphoretic. Initially a 9/10. Gradually got down to a 3/10. Did not take rx for the pain. En route to hospital, was given ASA. After in hospital, pain was 3/10, no change w/ deep inspiration. Pain finally resolved overnight, 0/10 now.   Does not remember ever having this pain before. When he works on a car or doing upper body exertion, he will get a sharp pain in his chest, not quite the same place as yesterday. It will last a minute or so at most. Has never taken rx for it or been evaluated.    About 10" after pain started, got dizzy and sat down. A few minutes later, felt heart flutter. 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 FD reported an irregular pulse, no strips available. Has never felt his heart flutter before.   He sometimes wakes w/ leg cramps. Denies orthopnea or PND  For about last 2 months, does not have as much energy as he used to. Decreased stamina, denies increased DOE.    Past Medical History:  Diagnosis Date  . Asthma 06/20/2017  . Carotid stenosis 10/08/2015  . Diarrhea 10/08/2015  . DVT (deep venous thrombosis) ([place])   . Hemorrhagic colitis 02/12/2015  . Hypercholesterolemia 06/20/2017  . Hypertension associated with type 2 diabetes mellitus ([place])   . IBD (inflammatory bowel disease) 10/08/2015  . Ketoacidosis 02/12/2015  . Kidney stones 06/20/2017  . Migraine 03/25/2014  . Severe sepsis ([place]) 02/12/2015  . Stroke ([place]) 03/25/2014  . Type 2 diabetes mellitus without complication ([place]) 0/05/9322    Past Surgical History:  Procedure Laterality Date  . CHOLECYSTECTOMY    . COLON RESECTION     Partial, after trauma  . IVC FILTER INSERTION  2011     Prior to Admission medications   Medication Sig Start Date End Date Taking? Authorizing Provider  atorvastatin (LIPITOR) 40 MG tablet Take 40  mg by mouth daily.   Yes [provider]  carvedilol (COREG) 6.25 MG tablet TAKE ONE TABLET BY MOUTH TWICE DAILY WITH FOOD Patient taking differently: Take 6.25 mg by mouth 2 (two) times daily with a meal.  10/13/17  Yes Munley, Iline Oven, MD  clopidogrel (PLAVIX) 75 MG tablet Take 75 mg by mouth daily.   Yes [provider]  cyanocobalamin 2000 MCG tablet Take 2,000 mcg by mouth daily.    Yes [provider]  folic acid (FOLVITE) 1 MG tablet Take 1 mg by mouth daily.  08/12/16  Yes [provider]  insulin glargine (LANTUS) 100 unit/mL SOPN Inject 70 Units into the skin daily.   Yes [provider]  Lactobacillus ([employer]  PROBIOTIC ACIDOPHILUS) 10 MG CAPS Take 1 capsule by mouth daily.  02/18/15  Yes [provider]  metFORMIN (GLUCOPHAGE) 1000 MG tablet Take 1,000 mg by mouth 2 (two) times daily with a meal.  01/06/05  Yes [provider]  montelukast (SINGULAIR) 10 MG tablet Take 10 mg by mouth daily.  10/05/15  Yes [provider]  nitroGLYCERIN (NITROSTAT) 0.4 MG SL tablet  06/21/17  Yes [provider]  sildenafil (REVATIO) 20 MG tablet Take 40-100 mg by mouth as needed.  09/18/15  Yes [provider]  sitaGLIPtin (JANUVIA) 100 MG tablet Take 100 mg by mouth.   Yes [provider]  sulfaSALAzine (AZULFIDINE) 500 MG tablet Take 1,500 mg by mouth 2 (two) times daily.  08/12/16  Yes [provider]  SURE COMFORT PEN NEEDLES 31G X 8 MM MISC See admin instructions. 06/16/17  Yes [provider]  Vitamin D, Ergocalciferol, (DRISDOL) 50000 units CAPS capsule Take 50,000 Units by mouth every 7 (seven) days. Mondays 09/18/15  Yes [provider]    Inpatient Medications: Scheduled Meds: . atorvastatin  40 mg Oral Daily  . carvedilol  6.25 mg Oral BID WC  . clopidogrel  75 mg Oral Daily  . enoxaparin (LOVENOX) injection  40 mg Subcutaneous Q24H  . folic acid  1 mg Oral Daily  . montelukast  10 mg Oral Daily  . sulfaSALAzine  1,500 mg Oral BID   Continuous Infusions: . dextrose Stopped (03/25/19 0025)   PRN Meds: acetaminophen, dextrose, ondansetron (ZOFRAN) IV  Allergies:   No Known Allergies  Social History:   Social History   Socioeconomic History  . Marital status: Married    Spouse name: Not on file  . Number of children: Not on file  . Years of education: Not on file  . Highest education level: Not on file  Occupational History  . Occupation: Set designer, some Conservation officer, historic buildings: [employer]  Social Needs  . Financial resource strain: Not on file  . Food insecurity    Worry: Not on file    Inability:  Not on file  . Transportation needs    Medical: Not on file    Non-medical: Not on file  Tobacco Use  . Smoking status: Never Smoker  . Smokeless tobacco: Never Used  Substance and Sexual Activity  . Alcohol use: No    Frequency: Never  . Drug use: No  . Sexual activity: Not on file  Lifestyle  . Physical activity    Days per week: Not on file    Minutes per session: Not on file  . Stress: Not on file  Relationships  . Social connections    Talks on phone: Not on file  Gets together: Not on file    Attends religious service: Not on file    Active member of club or organization: Not on file    Attends meetings of clubs or organizations: Not on file    Relationship status: Not on file  . Intimate partner violence    Fear of current or ex partner: Not on file    Emotionally abused: Not on file    Physically abused: Not on file    Forced sexual activity: Not on file  Other Topics Concern  . Not on file  Social History Narrative   Lives in [place] with wife and disabled son.    Family History:   Family History  Problem Relation Age of Onset  . Hypertension Mother   . Heart disease Mother [id number]  . Diabetes Mother   . Diabetes Father [id number]       severe diabetic   Family Status:  Family Status  Relation Name Status  . Mother  Deceased  . Father  Deceased    ROS:  Please see the history of present illness.  All other ROS reviewed and negative.     Physical Exam/Data:   Vitals:   03/24/19 2018 03/25/19 0112 03/25/19 0412 03/25/19 0925  BP: (!) 152/77 127/61 113/68 124/79  Pulse: 72 66 68 83  Resp: 20 20 19    Temp: 97.7 F (36.5 C) 98.7 F (37.1 C) 98.4 F (36.9 C)   TempSrc: Oral Oral Oral   SpO2: 100% 96% 98%   Weight:   83.1 kg   Height:        Intake/Output Summary (Last 24 hours) at 03/25/2019 1100 Last data filed at 03/25/2019 0839 Gross per 24 hour  Intake 480 ml  Output 3575 ml  Net -3095 ml   Filed Weights   03/24/19 1846 03/25/19 0412   Weight: 82.7 kg 83.1 kg   Body mass index is 24.85 kg/m.  General:  Well nourished, well developed, in no acute distress HEENT: normal Lymph: no adenopathy Neck: no JVD Endocrine:  No thryomegaly Vascular: No carotid bruits; 4/4 extremity pulses 2+, without bruits  Cardiac:  normal S1, S2; RRR; no murmur  Lungs:  clear to auscultation bilaterally, no wheezing, rhonchi or rales  Abd: soft, nontender, no hepatomegaly  Ext: no edema Musculoskeletal:  No deformities, BUE and BLE strength normal and equal Skin: warm and dry  Neuro:  CNs 2-12 intact, no focal abnormalities noted Psych:  Normal affect   EKG:  The EKG was personally reviewed and demonstrates:  08/09 ECG is SR, HR 69, no acute ischemic changes, normal intervals Telemetry:  Telemetry was personally reviewed and demonstrates:  SR w/ ?junctional rhythm vs WAP at times, HR ok  Relevant CV Studies:  ECHO: 03/25/2019  1. The left ventricle has mildly reduced systolic function, with an ejection fraction of 45-50%. The cavity size was normal. Left ventricular diastolic Doppler parameters are consistent with impaired relaxation. There appears to be inferior and inferolateral hypokinesis.  2. The right ventricle has normal systolic function. The cavity was normal. There is no increase in right ventricular wall thickness.  3. No evidence of mitral valve stenosis. Trivial mitral regurgitation.  4. The aortic valve is tricuspid. No stenosis of the aortic valve.  5. The aorta is normal in size and structure.  6. The IVC was normal in size. No complete TR doppler jet so unable to estimate PA systolic pressure.  CARDIAC CTA: 07/19/2017  Calcium Score:  24 Agatston units.  Coronary Arteries: Right dominant with no anomalies LM:  No plaque or stenosis. LAD system: Mixed plaque in the ostial and proximal LAD, no more than mild stenosis. There was a large D1 without significant disease. Circumflex system:  No plaque or stenosis. RCA  system:  No plaque or stenosis. IMPRESSION: 1. Coronary artery calcium score of 24 Agatston units, placing the patient in the 64th percentile for age and gender, this suggests intermediate risk for future coronary events. 2.  Mild nonobstructive disease noted in the proximal LAD.   Laboratory Data:  Chemistry Recent Labs  Lab 03/24/19 1223  NA 140  K 3.9  CL 106  CO2 23  GLUCOSE 118*  BUN 14  CREATININE 1.01  CALCIUM 9.2  GFRNONAA >60  GFRAA >60  ANIONGAP 11    No results found for: ALT, AST, GGT, ALKPHOS, BILITOT Hematology Recent Labs  Lab 03/24/19 1330  WBC 6.0  RBC 4.83  HGB 14.3  HCT 40.5  MCV 83.9  MCH 29.6  MCHC 35.3  RDW 12.0  PLT 242   Cardiac Enzymes High Sensitivity Troponin:   Recent Labs  Lab 03/24/19 1223 03/24/19 1720  TROPONINIHS 2 3     TSH: No results found for: TSH   Lipids: Lab Results  Component Value Date   CHOL 110 03/25/2019   HDL 48 03/25/2019   LDLCALC 43 03/25/2019   TRIG 96 03/25/2019   CHOLHDL 2.3 03/25/2019   HgbA1c:No results found for: HGBA1C Magnesium: No results found for: MG   Radiology/Studies:  Dg Chest 2 View  Result Date: 03/24/2019 CLINICAL DATA:  Syncopal episode.  Chest pain. EXAM: CHEST - 2 VIEW COMPARISON:  06/19/2017 FINDINGS: Midline trachea. Normal heart size and mediastinal contours. Numerous leads and wires project over the chest. No pleural effusion or pneumothorax. Clear lungs. Probable nipple shadow projecting over the left lung base; CTA of the chest is pending. IMPRESSION: No acute cardiopulmonary disease. Electronically Signed   By: Jeronimo GreavesKyle  Talbot M.D.   On: 03/24/2019 14:10   Ct Angio Chest/abd/pel For Dissection W And/or Wo Contrast  Result Date: 03/24/2019 CLINICAL DATA:  Chest pain.  Right hand tingling. EXAM: CT ANGIOGRAPHY CHEST, ABDOMEN AND PELVIS TECHNIQUE: Multidetector CT imaging through the chest, abdomen and pelvis was performed using the standard protocol during bolus  administration of intravenous contrast. Multiplanar reconstructed images and MIPs were obtained and reviewed to evaluate the vascular anatomy. CONTRAST:  100mL OMNIPAQUE IOHEXOL 350 MG/ML SOLN COMPARISON:  CT the abdomen and pelvis January 23, 2015. CT of the chest February 22, 2012. FINDINGS: CTA CHEST FINDINGS Cardiovascular: Coronary artery calcifications are seen in the left main and anterior descending coronary arteries. Probable mild coronary artery calcifications in the right coronary artery. The heart is unremarkable. Central pulmonary arteries are normal in caliber with no filling defects identified. Minimal atherosclerotic changes in the thoracic aorta. The ascending thoracic aorta measures up to 4.2 cm just proximal to the arch. This is not significantly changed, measuring 4.1 cm previously. The remainder of the thoracic aorta is normal in caliber with no other evidence of aneurysm. No dissection. The left subclavian artery is normal in appearance. The left carotid artery, right carotid artery, and right subclavian artery arise from a common trunk. There is mild atherosclerotic change in the left common carotid artery seen on image 1. The right common carotid artery is normal in appearance. Visualized portions of the right subclavian artery are normal in appearance. A portion is obscured by streak artifact off the right  subclavian vein which is contrast filled. Visualized portions of the vertebral arteries are normal. Mediastinum/Nodes: No enlarged mediastinal, hilar, or axillary lymph nodes. Thyroid gland, trachea, and esophagus demonstrate no significant findings. Lungs/Pleura: A nodular density in the right lung on series 6, image 34 is unchanged since 2013, not requiring follow-up. No other abnormalities in the lungs. The central airways are normal. No pneumothorax. Musculoskeletal: See below. Review of the MIP images confirms the above findings. CTA ABDOMEN AND PELVIS FINDINGS VASCULAR Aorta: Mild  atherosclerotic changes seen in the nonaneurysmal abdominal aorta. No dissection. Celiac: Patent without evidence of aneurysm, dissection, vasculitis or significant stenosis. SMA: Patent without evidence of aneurysm, dissection, vasculitis or significant stenosis. Renals: Both renal arteries are patent without evidence of aneurysm, dissection, vasculitis, fibromuscular dysplasia or significant stenosis. IMA: Patent without evidence of aneurysm, dissection, vasculitis or significant stenosis. Iliac and femoral arteries: The iliac and femoral arteries are widely patent and well opacified with no aneurysm or dissection. Minimal atherosclerotic changes. Inflow: Patent without evidence of aneurysm, dissection, vasculitis or significant stenosis. Veins: No obvious venous abnormality within the limitations of this arterial phase study. Review of the MIP images confirms the above findings. NON-VASCULAR Hepatobiliary: Evaluation is limited due to arterial phase imaging. Within this limitation, no abnormalities are identified. The patient is status post cholecystectomy. Pancreas: Unremarkable. No pancreatic ductal dilatation or surrounding inflammatory changes. Spleen: Normal in size without focal abnormality. Adrenals/Urinary Tract: Bilateral renal stones are identified. The largest stone is seen on the right measuring up to 13 mm. No hydronephrosis or perinephric stranding identified. The ureters and bladder are normal. Stomach/Bowel: A few jejunal loops appear somewhat thick walled such as on axial image 181 of series 7. No adjacent stranding. No obstruction identified. The remainder of the small bowel is normal. There is moderate fecal loading in the colon. The colon is otherwise normal. The appendix is not seen but there is no secondary evidence of appendicitis. Lymphatic: No adenopathy is identified. Reproductive: Prostate is unremarkable. Other: There is a fat containing right inguinal hernia which is small. There is a  small periumbilical fat containing hernia hernia. No free air free fluid. Musculoskeletal: Mild anterior wedging of T12 is stable. No other bony abnormalities are identified. Probable previous sternal fracture. Review of the MIP images confirms the above findings. IMPRESSION: 1. 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 No dissection in the thoracic or abdominal aorta. Mild atherosclerotic changes in the thoracic and abdominal aorta. No cause for the patient's left arm symptoms identified. Recommend annual imaging followup by CTA or MRA. This recommendation follows 2010 ACCF/AHA/AATS/ACR/ASA/SCA/SCAI/SIR/STS/SVM Guidelines for the Diagnosis and Management of Patients with Thoracic Aortic Disease. Circulation. 2010; 121: Z610-R604. Aortic aneurysm NOS (ICD10-I71.9) 2. Coronary artery calcifications most prominent in the left main and LAD. There appears to be more mild involvement of the right coronary artery. 3. Nonobstructive renal stones. A 13 mm stone is seen in the lower pole the right kidney. 4. There are a few jejunal loops which demonstrate prominence of the wall and possible thickening. This could be due to the lack of distention. In the appropriate clinical setting, enteritis could have a similar appearance. Recommend clinical correlation. 5. Small fat containing right inguinal hernia. 6. Chronic compression fracture of T12, mild. Electronically Signed   By: Gerome Sam III M.D   On: 03/24/2019 15:07    Assessment and Plan:   1. Chest pain - atypical sx and negative ez, but EF abnl on  echo w/ +WMA>>reviewed w/ MD, cath indicated - he is NPO for cath - The risks and benefits of a cardiac catheterization including, but not limited to, death, stroke, MI, kidney damage and bleeding were discussed with the patient and his wife who indicate understanding and agree to proceed.  - will start hydration ASAP since he had dye yesterday  2. LVD -  mildly reduced systolic function w/out cardiac enlargement - continue BB and Plavix, not on ASA because of Plavix since CVA.  - consider adding ARB if BP allows after cath.  3. DM - takes Lantus q am, no recent change in meds - consider decreasing pm metformin to 500 mg - will leave to IM  4. Syncope - pt out for several minutes, based on his memories - no sig brady/tachy-arrhythmia on telemetry, but he had palpitations prior to syncope and has Junctional rhythm at times, vs WAP - MD to advise if pt cannot drive for 6 months. - MD advise on event monitor as outpt.  5. Enlarged ascending aorta - 42 mm w/ no sig change since 2013 - f/u 1 year  Otherwise, per IM Principal Problem:   Chest pain Active Problems:   Type 2 diabetes mellitus without complication (HCC)   CAD in native artery   History of CVA (cerebrovascular accident)   AAA (abdominal aortic aneurysm) (HCC)   Syncope and collapse  For questions or updates, please contact [facility] Please consult [email or web address] for contact info under Cardiology/STEMI.   Signed, Theodore DemarkRhonda Barrett, PA-C  03/25/2019 11:00 AM  Personally seen and examined. Agree with above.   58 year old with minimal plaque on coronary CT scan in 2018 with hypertension hyperlipidemia history of stroke who had a syncopal episode at work after developing sharp chest pain.  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  Next thing he remembers he was with the fireman with oxygen on.  Blood sugar was 120 at the time there.  He has had episodes in the past where he is felt faint or dizzy and blood sugar was quite low at that time.  CT scan was performed here to make sure there was no dissection and this was negative.  Also proximal pulmonary arteries looked normal.  I have asked Dr. Mayford KnifeWilliams with radiology to review the CT scan to see if he can comment on the pulmonary arteries  further to make sure that he does not have any evidence of pulmonary embolism.  GEN: Well nourished, well developed, in no acute distress  HEENT: normal  Neck: no JVD, carotid bruits, or masses Cardiac: RRR; no murmurs, rubs, or gallops,no edema  Respiratory:  clear to auscultation bilaterally, normal work of breathing GI: soft, nontender, nondistended, + BS MS: no deformity or atrophy  Skin: warm and dry, no rash Neuro:  Alert and Oriented x 3, Strength and sensation are intact Psych: euthymic mood, full affect  Troponins are normal.  EKG shows sinus rhythm with no other significant abnormalities.  Telemetry personally reviewed shows sinus rhythm with PACs and occasional ectopic atrial rhythm.  No pauses.  Syncope - CBG was 122 right after he passed out for the fireman but it was 45 on admission.  Dropped to 33 overnight.  I certainly wonder if this is playing a role.  Also, with his dizziness, sweats, this could have been a vagal episode.  Interestingly, his echocardiogram demonstrates a mildly reduced ejection fraction.  Prior CT scan did not show any evidence of significant coronary artery disease.  Minor plaquing noted previously.  I think that given his constellation of symptoms including sharp chest pain, it would make sense for [place] to exclude confidently coronary artery disease especially with his reduction in ejection fraction.  There was no evidence of dissection.  No evidence of main pulmonary artery pulmonary embolism.  Hopefully catheterization will be normal.  Cardiac catheterization discussed risks of benefits including stroke heart attack death renal impairment have been discussed.  He would need to refrain from working until Friday to allow for proper healing of his right radial wrist site.  We would also place a 30-day event monitor on him at discharge.  In addition, he has been counseled on 43-month refrain of driving given his unexplained syncopal episode.  Dilated Aortic root  -42 mm no change since 2013.  Continue to follow.  MRI would be reasonable next year.  Patrick Schultz, MD

## 2019-03-25 NOTE — Consult Note (Addendum)
Cardiology Consultation:   Patient ID: Patrick Curry; [id number]; 08/31/1960   Admit date: 03/24/2019 Date of Consult: 03/25/2019  Primary Care Provider: [name], John F., MD Primary Cardiologist: Brian Munley, MD 07/20/2017 Primary Electrophysiologist:  None   Patient Profile:   Patrick Curry is a 58 y.o. male with a hx of CP w/ mild dz on cardiac CT 2018, DM, HTN, HLD, CVA, IBD, DVT, asthma, carotid dz who is being seen today for the evaluation of CP and syncope at the request of Dr Smith.  History of Present Illness:   [name] was last seen by cardiology in 2018, was to f/u prn.   He was admitted 08/09 after prolonged syncope w/ significant prodrome, CBG 45 on arrival to [facility]. Echo was abnl, cards asked to see.   Pt says boss told him CBG was 122 right after he passed out per firemen. CBG 45 on admission. Came up to 125 but dropped to 33 overnight, rx w/ IVF. No recent changes in regimen, Lantus 70 U qam, glucophage 1000 mg bid, Januvia 100 mg qd. HgbA1c usually in the 6-7 range  Mr Eoff was at work when he developed sx. Works a 12 hr shift, 4 days a week. He had eaten breakfast and taken meds like normal. He felt normal when he got up, CBG 92.  Work was normal, no more strenuous than usual. About 8:30, he got CP, just to the L of the upper sternum. Initially felt like needles, then got sharp. Does not remember if it was worse with deep inspiration. Does not remember being SOB or nauseated. Was diaphoretic. Initially a 9/10. Gradually got down to a 3/10. Did not take rx for the pain. En route to hospital, was given ASA. After in hospital, pain was 3/10, no change w/ deep inspiration. Pain finally resolved overnight, 0/10 now.   Does not remember ever having this pain before. When he works on a car or doing upper body exertion, he will get a sharp pain in his chest, not quite the same place as yesterday. It will last a minute or so at most. Has never taken rx for it or been evaluated.    About 10" after pain started, got dizzy and sat down. A few minutes later, felt heart flutter. 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 FD reported an irregular pulse, no strips available. Has never felt his heart flutter before.   He sometimes wakes w/ leg cramps. Denies orthopnea or PND  For about last 2 months, does not have as much energy as he used to. Decreased stamina, denies increased DOE.    Past Medical History:  Diagnosis Date  . Asthma 06/20/2017  . Carotid stenosis 10/08/2015  . Diarrhea 10/08/2015  . DVT (deep venous thrombosis) (HCC)   . Hemorrhagic colitis 02/12/2015  . Hypercholesterolemia 06/20/2017  . Hypertension associated with type 2 diabetes mellitus (HCC)   . IBD (inflammatory bowel disease) 10/08/2015  . Ketoacidosis 02/12/2015  . Kidney stones 06/20/2017  . Migraine 03/25/2014  . Severe sepsis (HCC) 02/12/2015  . Stroke (HCC) 03/25/2014  . Type 2 diabetes mellitus without complication (HCC) 02/12/2015    Past Surgical History:  Procedure Laterality Date  . CHOLECYSTECTOMY    . COLON RESECTION     Partial, after trauma  . IVC FILTER INSERTION  2011     Prior to Admission medications   Medication Sig Start Date End Date Taking? Authorizing Provider  atorvastatin (LIPITOR) 40 MG tablet Take 40   mg by mouth daily.   Yes [provider]  carvedilol (COREG) 6.25 MG tablet TAKE ONE TABLET BY MOUTH TWICE DAILY WITH FOOD Patient taking differently: Take 6.25 mg by mouth 2 (two) times daily with a meal.  10/13/17  Yes Munley, Brian J, MD  clopidogrel (PLAVIX) 75 MG tablet Take 75 mg by mouth daily.   Yes [provider]  cyanocobalamin 2000 MCG tablet Take 2,000 mcg by mouth daily.    Yes [provider]  folic acid (FOLVITE) 1 MG tablet Take 1 mg by mouth daily.  08/12/16  Yes [provider]  insulin glargine (LANTUS) 100 unit/mL SOPN Inject 70 Units into the skin daily.   Yes [provider]  Lactobacillus ([employer]  PROBIOTIC ACIDOPHILUS) 10 MG CAPS Take 1 capsule by mouth daily.  02/18/15  Yes [provider]  metFORMIN (GLUCOPHAGE) 1000 MG tablet Take 1,000 mg by mouth 2 (two) times daily with a meal.  01/06/05  Yes [provider]  montelukast (SINGULAIR) 10 MG tablet Take 10 mg by mouth daily.  10/05/15  Yes [provider]  nitroGLYCERIN (NITROSTAT) 0.4 MG SL tablet  06/21/17  Yes [provider]  sildenafil (REVATIO) 20 MG tablet Take 40-100 mg by mouth as needed.  09/18/15  Yes [provider]  sitaGLIPtin (JANUVIA) 100 MG tablet Take 100 mg by mouth.   Yes [provider]  sulfaSALAzine (AZULFIDINE) 500 MG tablet Take 1,500 mg by mouth 2 (two) times daily.  08/12/16  Yes [provider]  SURE COMFORT PEN NEEDLES 31G X 8 MM MISC See admin instructions. 06/16/17  Yes [provider]  Vitamin D, Ergocalciferol, (DRISDOL) 50000 units CAPS capsule Take 50,000 Units by mouth every 7 (seven) days. Mondays 09/18/15  Yes [provider]    Inpatient Medications: Scheduled Meds: . atorvastatin  40 mg Oral Daily  . carvedilol  6.25 mg Oral BID WC  . clopidogrel  75 mg Oral Daily  . enoxaparin (LOVENOX) injection  40 mg Subcutaneous Q24H  . folic acid  1 mg Oral Daily  . montelukast  10 mg Oral Daily  . sulfaSALAzine  1,500 mg Oral BID   Continuous Infusions: . dextrose Stopped (03/25/19 0025)   PRN Meds: acetaminophen, dextrose, ondansetron (ZOFRAN) IV  Allergies:   No Known Allergies  Social History:   Social History   Socioeconomic History  . Marital status: Married    Spouse name: Not on file  . Number of children: Not on file  . Years of education: Not on file  . Highest education level: Not on file  Occupational History  . Occupation: manufacturing, some lifting    Employer: [employer]  Social Needs  . Financial resource strain: Not on file  . Food insecurity    Worry: Not on file    Inability:  Not on file  . Transportation needs    Medical: Not on file    Non-medical: Not on file  Tobacco Use  . Smoking status: Never Smoker  . Smokeless tobacco: Never Used  Substance and Sexual Activity  . Alcohol use: No    Frequency: Never  . Drug use: No  . Sexual activity: Not on file  Lifestyle  . Physical activity    Days per week: Not on file    Minutes per session: Not on file  . Stress: Not on file  Relationships  . Social connections    Talks on phone: Not on file      Gets together: Not on file    Attends religious service: Not on file    Active member of club or organization: Not on file    Attends meetings of clubs or organizations: Not on file    Relationship status: Not on file  . Intimate partner violence    Fear of current or ex partner: Not on file    Emotionally abused: Not on file    Physically abused: Not on file    Forced sexual activity: Not on file  Other Topics Concern  . Not on file  Social History Narrative   Lives in [place] with wife and disabled son.    Family History:   Family History  Problem Relation Age of Onset  . Hypertension Mother   . Heart disease Mother 62  . Diabetes Mother   . Diabetes Father 59       severe diabetic   Family Status:  Family Status  Relation Name Status  . Mother  Deceased  . Father  Deceased    ROS:  Please see the history of present illness.  All other ROS reviewed and negative.     Physical Exam/Data:   Vitals:   03/24/19 2018 03/25/19 0112 03/25/19 0412 03/25/19 0925  BP: (!) 152/77 127/61 113/68 124/79  Pulse: 72 66 68 83  Resp: 20 20 19   Temp: 97.7 F (36.5 C) 98.7 F (37.1 C) 98.4 F (36.9 C)   TempSrc: Oral Oral Oral   SpO2: 100% 96% 98%   Weight:   83.1 kg   Height:        Intake/Output Summary (Last 24 hours) at 03/25/2019 1100 Last data filed at 03/25/2019 0839 Gross per 24 hour  Intake 480 ml  Output 3575 ml  Net -3095 ml   Filed Weights   03/24/19 1846 03/25/19 0412   Weight: 82.7 kg 83.1 kg   Body mass index is 24.85 kg/m.  General:  Well nourished, well developed, in no acute distress HEENT: normal Lymph: no adenopathy Neck: no JVD Endocrine:  No thryomegaly Vascular: No carotid bruits; 4/4 extremity pulses 2+, without bruits  Cardiac:  normal S1, S2; RRR; no murmur  Lungs:  clear to auscultation bilaterally, no wheezing, rhonchi or rales  Abd: soft, nontender, no hepatomegaly  Ext: no edema Musculoskeletal:  No deformities, BUE and BLE strength normal and equal Skin: warm and dry  Neuro:  CNs 2-12 intact, no focal abnormalities noted Psych:  Normal affect   EKG:  The EKG was personally reviewed and demonstrates:  08/09 ECG is SR, HR 69, no acute ischemic changes, normal intervals Telemetry:  Telemetry was personally reviewed and demonstrates:  SR w/ ?junctional rhythm vs WAP at times, HR ok  Relevant CV Studies:  ECHO: 03/25/2019  1. The left ventricle has mildly reduced systolic function, with an ejection fraction of 45-50%. The cavity size was normal. Left ventricular diastolic Doppler parameters are consistent with impaired relaxation. There appears to be inferior and inferolateral hypokinesis.  2. The right ventricle has normal systolic function. The cavity was normal. There is no increase in right ventricular wall thickness.  3. No evidence of mitral valve stenosis. Trivial mitral regurgitation.  4. The aortic valve is tricuspid. No stenosis of the aortic valve.  5. The aorta is normal in size and structure.  6. The IVC was normal in size. No complete TR doppler jet so unable to estimate PA systolic pressure.  CARDIAC CTA: 07/19/2017  Calcium Score:  24 Agatston units.   Coronary Arteries: Right dominant with no anomalies LM:  No plaque or stenosis. LAD system: Mixed plaque in the ostial and proximal LAD, no more than mild stenosis. There was a large D1 without significant disease. Circumflex system:  No plaque or stenosis. RCA  system:  No plaque or stenosis. IMPRESSION: 1. Coronary artery calcium score of 24 Agatston units, placing the patient in the 64th percentile for age and gender, this suggests intermediate risk for future coronary events. 2.  Mild nonobstructive disease noted in the proximal LAD.   Laboratory Data:  Chemistry Recent Labs  Lab 03/24/19 1223  NA 140  K 3.9  CL 106  CO2 23  GLUCOSE 118*  BUN 14  CREATININE 1.01  CALCIUM 9.2  GFRNONAA >60  GFRAA >60  ANIONGAP 11    No results found for: ALT, AST, GGT, ALKPHOS, BILITOT Hematology Recent Labs  Lab 03/24/19 1330  WBC 6.0  RBC 4.83  HGB 14.3  HCT 40.5  MCV 83.9  MCH 29.6  MCHC 35.3  RDW 12.0  PLT 242   Cardiac Enzymes High Sensitivity Troponin:   Recent Labs  Lab 03/24/19 1223 03/24/19 1720  TROPONINIHS 2 3     TSH: No results found for: TSH   Lipids: Lab Results  Component Value Date   CHOL 110 03/25/2019   HDL 48 03/25/2019   LDLCALC 43 03/25/2019   TRIG 96 03/25/2019   CHOLHDL 2.3 03/25/2019   HgbA1c:No results found for: HGBA1C Magnesium: No results found for: MG   Radiology/Studies:  Dg Chest 2 View  Result Date: 03/24/2019 CLINICAL DATA:  Syncopal episode.  Chest pain. EXAM: CHEST - 2 VIEW COMPARISON:  06/19/2017 FINDINGS: Midline trachea. Normal heart size and mediastinal contours. Numerous leads and wires project over the chest. No pleural effusion or pneumothorax. Clear lungs. Probable nipple shadow projecting over the left lung base; CTA of the chest is pending. IMPRESSION: No acute cardiopulmonary disease. Electronically Signed   By: Kyle  Talbot M.D.   On: 03/24/2019 14:10   Ct Angio Chest/abd/pel For Dissection W And/or Wo Contrast  Result Date: 03/24/2019 CLINICAL DATA:  Chest pain.  Right hand tingling. EXAM: CT ANGIOGRAPHY CHEST, ABDOMEN AND PELVIS TECHNIQUE: Multidetector CT imaging through the chest, abdomen and pelvis was performed using the standard protocol during bolus  administration of intravenous contrast. Multiplanar reconstructed images and MIPs were obtained and reviewed to evaluate the vascular anatomy. CONTRAST:  100mL OMNIPAQUE IOHEXOL 350 MG/ML SOLN COMPARISON:  CT the abdomen and pelvis January 23, 2015. CT of the chest February 22, 2012. FINDINGS: CTA CHEST FINDINGS Cardiovascular: Coronary artery calcifications are seen in the left main and anterior descending coronary arteries. Probable mild coronary artery calcifications in the right coronary artery. The heart is unremarkable. Central pulmonary arteries are normal in caliber with no filling defects identified. Minimal atherosclerotic changes in the thoracic aorta. The ascending thoracic aorta measures up to 4.2 cm just proximal to the arch. This is not significantly changed, measuring 4.1 cm previously. The remainder of the thoracic aorta is normal in caliber with no other evidence of aneurysm. No dissection. The left subclavian artery is normal in appearance. The left carotid artery, right carotid artery, and right subclavian artery arise from a common trunk. There is mild atherosclerotic change in the left common carotid artery seen on image 1. The right common carotid artery is normal in appearance. Visualized portions of the right subclavian artery are normal in appearance. A portion is obscured by streak artifact off the right   subclavian vein which is contrast filled. Visualized portions of the vertebral arteries are normal. Mediastinum/Nodes: No enlarged mediastinal, hilar, or axillary lymph nodes. Thyroid gland, trachea, and esophagus demonstrate no significant findings. Lungs/Pleura: A nodular density in the right lung on series 6, image 34 is unchanged since 2013, not requiring follow-up. No other abnormalities in the lungs. The central airways are normal. No pneumothorax. Musculoskeletal: See below. Review of the MIP images confirms the above findings. CTA ABDOMEN AND PELVIS FINDINGS VASCULAR Aorta: Mild  atherosclerotic changes seen in the nonaneurysmal abdominal aorta. No dissection. Celiac: Patent without evidence of aneurysm, dissection, vasculitis or significant stenosis. SMA: Patent without evidence of aneurysm, dissection, vasculitis or significant stenosis. Renals: Both renal arteries are patent without evidence of aneurysm, dissection, vasculitis, fibromuscular dysplasia or significant stenosis. IMA: Patent without evidence of aneurysm, dissection, vasculitis or significant stenosis. Iliac and femoral arteries: The iliac and femoral arteries are widely patent and well opacified with no aneurysm or dissection. Minimal atherosclerotic changes. Inflow: Patent without evidence of aneurysm, dissection, vasculitis or significant stenosis. Veins: No obvious venous abnormality within the limitations of this arterial phase study. Review of the MIP images confirms the above findings. NON-VASCULAR Hepatobiliary: Evaluation is limited due to arterial phase imaging. Within this limitation, no abnormalities are identified. The patient is status post cholecystectomy. Pancreas: Unremarkable. No pancreatic ductal dilatation or surrounding inflammatory changes. Spleen: Normal in size without focal abnormality. Adrenals/Urinary Tract: Bilateral renal stones are identified. The largest stone is seen on the right measuring up to 13 mm. No hydronephrosis or perinephric stranding identified. The ureters and bladder are normal. Stomach/Bowel: A few jejunal loops appear somewhat thick walled such as on axial image 181 of series 7. No adjacent stranding. No obstruction identified. The remainder of the small bowel is normal. There is moderate fecal loading in the colon. The colon is otherwise normal. The appendix is not seen but there is no secondary evidence of appendicitis. Lymphatic: No adenopathy is identified. Reproductive: Prostate is unremarkable. Other: There is a fat containing right inguinal hernia which is small. There is a  small periumbilical fat containing hernia hernia. No free air free fluid. Musculoskeletal: Mild anterior wedging of T12 is stable. No other bony abnormalities are identified. Probable previous sternal fracture. Review of the MIP images confirms the above findings. IMPRESSION: 1. 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 No dissection in the thoracic or abdominal aorta. Mild atherosclerotic changes in the thoracic and abdominal aorta. No cause for the patient's left arm symptoms identified. Recommend annual imaging followup by CTA or MRA. This recommendation follows 2010 ACCF/AHA/AATS/ACR/ASA/SCA/SCAI/SIR/STS/SVM Guidelines for the Diagnosis and Management of Patients with Thoracic Aortic Disease. Circulation. 2010; 121: E266-e369. Aortic aneurysm NOS (ICD10-I71.9) 2. Coronary artery calcifications most prominent in the left main and LAD. There appears to be more mild involvement of the right coronary artery. 3. Nonobstructive renal stones. A 13 mm stone is seen in the lower pole the right kidney. 4. There are a few jejunal loops which demonstrate prominence of the wall and possible thickening. This could be due to the lack of distention. In the appropriate clinical setting, enteritis could have a similar appearance. Recommend clinical correlation. 5. Small fat containing right inguinal hernia. 6. Chronic compression fracture of T12, mild. Electronically Signed   By: David  Williams III M.D   On: 03/24/2019 15:07    Assessment and Plan:   1. Chest pain - atypical sx and negative ez, but EF abnl on   echo w/ +WMA>>reviewed w/ MD, cath indicated - he is NPO for cath - The risks and benefits of a cardiac catheterization including, but not limited to, death, stroke, MI, kidney damage and bleeding were discussed with the patient and his wife who indicate understanding and agree to proceed.  - will start hydration ASAP since he had dye yesterday  2. LVD -  mildly reduced systolic function w/out cardiac enlargement - continue BB and Plavix, not on ASA because of Plavix since CVA.  - consider adding ARB if BP allows after cath.  3. DM - takes Lantus q am, no recent change in meds - consider decreasing pm metformin to 500 mg - will leave to IM  4. Syncope - pt out for several minutes, based on his memories - no sig brady/tachy-arrhythmia on telemetry, but he had palpitations prior to syncope and has Junctional rhythm at times, vs WAP - MD to advise if pt cannot drive for 6 months. - MD advise on event monitor as outpt.  5. Enlarged ascending aorta - 42 mm w/ no sig change since 2013 - f/u 1 year  Otherwise, per IM Principal Problem:   Chest pain Active Problems:   Type 2 diabetes mellitus without complication (HCC)   CAD in native artery   History of CVA (cerebrovascular accident)   AAA (abdominal aortic aneurysm) (HCC)   Syncope and collapse  For questions or updates, please contact [facility] Please consult [email or web address] for contact info under Cardiology/STEMI.   Signed, Rhonda Barrett, PA-C  03/25/2019 11:00 AM  Personally seen and examined. Agree with above.   58-year-old with minimal plaque on coronary CT scan in 2018 with hypertension hyperlipidemia history of stroke who had a syncopal episode at work after developing sharp chest pain.  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  Next thing he remembers he was with the fireman with oxygen on.  Blood sugar was 120 at the time there.  He has had episodes in the past where he is felt faint or dizzy and blood sugar was quite low at that time.  CT scan was performed here to make sure there was no dissection and this was negative.  Also proximal pulmonary arteries looked normal.  I have asked Dr. Williams with radiology to review the CT scan to see if he can comment on the pulmonary arteries  further to make sure that he does not have any evidence of pulmonary embolism.  GEN: Well nourished, well developed, in no acute distress  HEENT: normal  Neck: no JVD, carotid bruits, or masses Cardiac: RRR; no murmurs, rubs, or gallops,no edema  Respiratory:  clear to auscultation bilaterally, normal work of breathing GI: soft, nontender, nondistended, + BS MS: no deformity or atrophy  Skin: warm and dry, no rash Neuro:  Alert and Oriented x 3, Strength and sensation are intact Psych: euthymic mood, full affect  Troponins are normal.  EKG shows sinus rhythm with no other significant abnormalities.  Telemetry personally reviewed shows sinus rhythm with PACs and occasional ectopic atrial rhythm.  No pauses.  Syncope - CBG was 122 right after he passed out for the fireman but it was 45 on admission.  Dropped to 33 overnight.  I certainly wonder if this is playing a role.  Also, with his dizziness, sweats, this could have been a vagal episode.  Interestingly, his echocardiogram demonstrates a mildly reduced ejection fraction.    Prior CT scan did not show any evidence of significant coronary artery disease.  Minor plaquing noted previously.  I think that given his constellation of symptoms including sharp chest pain, it would make sense for us to exclude confidently coronary artery disease especially with his reduction in ejection fraction.  There was no evidence of dissection.  No evidence of main pulmonary artery pulmonary embolism.  Hopefully catheterization will be normal.  Cardiac catheterization discussed risks of benefits including stroke heart attack death renal impairment have been discussed.  He would need to refrain from working until Friday to allow for proper healing of his right radial wrist site.  We would also place a 30-day event monitor on him at discharge.  In addition, he has been counseled on 6-month refrain of driving given his unexplained syncopal episode.  Dilated Aortic root  -42 mm no change since 2013.  Continue to follow.  MRI would be reasonable next year.  Mark Skains, MD   

## 2019-03-25 NOTE — Progress Notes (Signed)
Echocardiogram 2D Echocardiogram has been performed.  Oneal Deputy Erico Stan 03/25/2019, 8:53 AM

## 2019-03-26 ENCOUNTER — Other Ambulatory Visit: Payer: Self-pay | Admitting: Physician Assistant

## 2019-03-26 ENCOUNTER — Encounter (HOSPITAL_COMMUNITY): Payer: Self-pay | Admitting: Internal Medicine

## 2019-03-26 DIAGNOSIS — Z8673 Personal history of transient ischemic attack (TIA), and cerebral infarction without residual deficits: Secondary | ICD-10-CM | POA: Diagnosis not present

## 2019-03-26 DIAGNOSIS — I428 Other cardiomyopathies: Secondary | ICD-10-CM

## 2019-03-26 DIAGNOSIS — I2510 Atherosclerotic heart disease of native coronary artery without angina pectoris: Secondary | ICD-10-CM | POA: Diagnosis not present

## 2019-03-26 DIAGNOSIS — R079 Chest pain, unspecified: Secondary | ICD-10-CM | POA: Diagnosis not present

## 2019-03-26 DIAGNOSIS — R55 Syncope and collapse: Secondary | ICD-10-CM

## 2019-03-26 DIAGNOSIS — I714 Abdominal aortic aneurysm, without rupture: Secondary | ICD-10-CM | POA: Diagnosis not present

## 2019-03-26 HISTORY — DX: Other cardiomyopathies: I42.8

## 2019-03-26 LAB — BASIC METABOLIC PANEL
Anion gap: 9 (ref 5–15)
BUN: 18 mg/dL (ref 6–20)
CO2: 23 mmol/L (ref 22–32)
Calcium: 8.5 mg/dL — ABNORMAL LOW (ref 8.9–10.3)
Chloride: 104 mmol/L (ref 98–111)
Creatinine, Ser: 0.95 mg/dL (ref 0.61–1.24)
GFR calc Af Amer: >60 mL/min (ref >60)
GFR calc non Af Amer: >60 mL/min (ref >60)
Glucose, Bld: 251 mg/dL — ABNORMAL HIGH (ref 70–99)
Potassium: 3.8 mmol/L (ref 3.5–5.1)
Sodium: 136 mmol/L (ref 135–145)

## 2019-03-26 LAB — CBC
HCT: 42.2 % (ref 39.0–52.0)
Hemoglobin: 14.9 g/dL (ref 13.0–17.0)
MCH: 29.6 pg (ref 26.0–34.0)
MCHC: 35.3 g/dL (ref 30.0–36.0)
MCV: 83.9 fL (ref 80.0–100.0)
Platelets: 251 10*3/uL (ref 150–400)
RBC: 5.03 MIL/uL (ref 4.22–5.81)
RDW: 12.1 % (ref 11.5–15.5)
WBC: 7.4 10*3/uL (ref 4.0–10.5)
nRBC: 0 % (ref 0.0–0.2)

## 2019-03-26 LAB — GLUCOSE, CAPILLARY
Glucose-Capillary: 238 mg/dL — ABNORMAL HIGH (ref 70–99)
Glucose-Capillary: 165 mg/dL — ABNORMAL HIGH (ref 70–99)
Glucose-Capillary: 200 mg/dL — ABNORMAL HIGH (ref 70–99)

## 2019-03-26 MED ORDER — INSULIN GLARGINE 100 UNIT/ML ~~LOC~~ SOLN
35.0000 [IU] | SUBCUTANEOUS | Status: DC
  Administered 2019-03-26: 35 [IU] via SUBCUTANEOUS
  Filled 2019-03-26 (×2): qty 0.35

## 2019-03-26 MED ORDER — LISINOPRIL 5 MG PO TABS: 5.0000 mg | ORAL_TABLET | Freq: Every day | ORAL | 1 refills | Status: AC

## 2019-03-26 MED ORDER — LISINOPRIL 5 MG PO TABS
5.0000 mg | ORAL_TABLET | Freq: Every day | ORAL | Status: DC
  Administered 2019-03-26: 5 mg via ORAL
  Filled 2019-03-26: qty 1

## 2019-03-26 NOTE — Progress Notes (Signed)
Progress Note  Patient Name: Patrick CoyerRalph Curry Date of Encounter: 03/26/2019  Primary Cardiologist: Norman HerrlichBrian Munley, MD   Subjective   Feeling better.  Doing well.  No significant chest pain this morning.  No shortness of breath.  Ready to go.  Inpatient Medications    Scheduled Meds:  atorvastatin  40 mg Oral Daily   carvedilol  6.25 mg Oral BID WC   clopidogrel  75 mg Oral Daily   enoxaparin (LOVENOX) injection  40 mg Subcutaneous Q24H   folic acid  1 mg Oral Daily   insulin aspart  0-9 Units Subcutaneous Q4H   montelukast  10 mg Oral Daily   sodium chloride flush  3 mL Intravenous Q12H   sulfaSALAzine  1,500 mg Oral BID   Continuous Infusions:  sodium chloride     PRN Meds: sodium chloride, acetaminophen, dextrose, ondansetron (ZOFRAN) IV, sodium chloride flush   Vital Signs    Vitals:   03/25/19 2139 03/25/19 2158 03/26/19 0416 03/26/19 0634  BP: 131/71 130/71 128/74   Pulse: (!) 59 62 (!) 59   Resp:   20   Temp:   98.2 F (36.8 C)   TempSrc:   Oral   SpO2:   96%   Weight:    82.6 kg  Height:        Intake/Output Summary (Last 24 hours) at 03/26/2019 0734 Last data filed at 03/25/2019 2148 Gross per 24 hour  Intake 460 ml  Output 675 ml  Net -215 ml   Last 3 Weights 03/26/2019 03/25/2019 03/24/2019  Weight (lbs) 182 lb 183 lb 3.2 oz 182 lb 4.8 oz  Weight (kg) 82.555 kg 83.099 kg 82.691 kg      Telemetry    NSR with PAC's - Personally Reviewed  ECG    NSR - Personally Reviewed  Physical Exam   GEN: No acute distress.   Neck: No JVD Cardiac: RRR, no murmurs, rubs, or gallops.  Respiratory: Clear to auscultation bilaterally. GI: Soft, nontender, non-distended  MS: No edema; No deformity. Neuro:  Nonfocal  Psych: Normal affect   Labs    High Sensitivity Troponin:   Recent Labs  Lab 03/24/19 1223 03/24/19 1720  TROPONINIHS 2 3      Cardiac EnzymesNo results for input(s): TROPONINI in the last 168 hours. No results for input(s):  TROPIPOC in the last 168 hours.   Chemistry Recent Labs  Lab 03/24/19 1223 03/26/19 0347  NA 140 136  K 3.9 3.8  CL 106 104  CO2 23 23  GLUCOSE 118* 251*  BUN 14 18  CREATININE 1.01 0.95  CALCIUM 9.2 8.5*  GFRNONAA >60 >60  GFRAA >60 >60  ANIONGAP 11 9     Hematology Recent Labs  Lab 03/24/19 1330 03/26/19 0347  WBC 6.0 7.4  RBC 4.83 5.03  HGB 14.3 14.9  HCT 40.5 42.2  MCV 83.9 83.9  MCH 29.6 29.6  MCHC 35.3 35.3  RDW 12.0 12.1  PLT 242 251    BNPNo results for input(s): BNP, PROBNP in the last 168 hours.   DDimer No results for input(s): DDIMER in the last 168 hours.   Radiology    Dg Chest 2 View  Result Date: 03/24/2019 CLINICAL DATA:  Syncopal episode.  Chest pain. EXAM: CHEST - 2 VIEW COMPARISON:  06/19/2017 FINDINGS: Midline trachea. Normal heart size and mediastinal contours. Numerous leads and wires project over the chest. No pleural effusion or pneumothorax. Clear lungs. Probable nipple shadow projecting over the left lung base;  CTA of the chest is pending. IMPRESSION: No acute cardiopulmonary disease. Electronically Signed   By: Jeronimo GreavesKyle  Talbot M.D.   On: 03/24/2019 14:10   Ct Angio Chest/abd/pel For Dissection W And/or Wo Contrast  Result Date: 03/24/2019 CLINICAL DATA:  Chest pain.  Right hand tingling. EXAM: CT ANGIOGRAPHY CHEST, ABDOMEN AND PELVIS TECHNIQUE: Multidetector CT imaging through the chest, abdomen and pelvis was performed using the standard protocol during bolus administration of intravenous contrast. Multiplanar reconstructed images and MIPs were obtained and reviewed to evaluate the vascular anatomy. CONTRAST:  100mL OMNIPAQUE IOHEXOL 350 MG/ML SOLN COMPARISON:  CT the abdomen and pelvis January 23, 2015. CT of the chest February 22, 2012. FINDINGS: CTA CHEST FINDINGS Cardiovascular: Coronary artery calcifications are seen in the left main and anterior descending coronary arteries. Probable mild coronary artery calcifications in the right coronary  artery. The heart is unremarkable. Central pulmonary arteries are normal in caliber with no filling defects identified. Minimal atherosclerotic changes in the thoracic aorta. The ascending thoracic aorta measures up to 4.2 cm just proximal to the arch. This is not significantly changed, measuring 4.1 cm previously. The remainder of the thoracic aorta is normal in caliber with no other evidence of aneurysm. No dissection. The left subclavian artery is normal in appearance. The left carotid artery, right carotid artery, and right subclavian artery arise from a common trunk. There is mild atherosclerotic change in the left common carotid artery seen on image 1. The right common carotid artery is normal in appearance. Visualized portions of the right subclavian artery are normal in appearance. A portion is obscured by streak artifact off the right subclavian vein which is contrast filled. Visualized portions of the vertebral arteries are normal. Mediastinum/Nodes: No enlarged mediastinal, hilar, or axillary lymph nodes. Thyroid gland, trachea, and esophagus demonstrate no significant findings. Lungs/Pleura: A nodular density in the right lung on series 6, image 34 is unchanged since 2013, not requiring follow-up. No other abnormalities in the lungs. The central airways are normal. No pneumothorax. Musculoskeletal: See below. Review of the MIP images confirms the above findings. CTA ABDOMEN AND PELVIS FINDINGS VASCULAR Aorta: Mild atherosclerotic changes seen in the nonaneurysmal abdominal aorta. No dissection. Celiac: Patent without evidence of aneurysm, dissection, vasculitis or significant stenosis. SMA: Patent without evidence of aneurysm, dissection, vasculitis or significant stenosis. Renals: Both renal arteries are patent without evidence of aneurysm, dissection, vasculitis, fibromuscular dysplasia or significant stenosis. IMA: Patent without evidence of aneurysm, dissection, vasculitis or significant stenosis.  Iliac and femoral arteries: The iliac and femoral arteries are widely patent and well opacified with no aneurysm or dissection. Minimal atherosclerotic changes. Inflow: Patent without evidence of aneurysm, dissection, vasculitis or significant stenosis. Veins: No obvious venous abnormality within the limitations of this arterial phase study. Review of the MIP images confirms the above findings. NON-VASCULAR Hepatobiliary: Evaluation is limited due to arterial phase imaging. Within this limitation, no abnormalities are identified. The patient is status post cholecystectomy. Pancreas: Unremarkable. No pancreatic ductal dilatation or surrounding inflammatory changes. Spleen: Normal in size without focal abnormality. Adrenals/Urinary Tract: Bilateral renal stones are identified. The largest stone is seen on the right measuring up to 13 mm. No hydronephrosis or perinephric stranding identified. The ureters and bladder are normal. Stomach/Bowel: A few jejunal loops appear somewhat thick walled such as on axial image 181 of series 7. No adjacent stranding. No obstruction identified. The remainder of the small bowel is normal. There is moderate fecal loading in the colon. The colon is otherwise normal. The  appendix is not seen but there is no secondary evidence of appendicitis. Lymphatic: No adenopathy is identified. Reproductive: Prostate is unremarkable. Other: There is a fat containing right inguinal hernia which is small. There is a small periumbilical fat containing hernia hernia. No free air free fluid. Musculoskeletal: Mild anterior wedging of T12 is stable. No other bony abnormalities are identified. Probable previous sternal fracture. Review of the MIP images confirms the above findings. IMPRESSION: 1. 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 No dissection in the thoracic or abdominal aorta. Mild atherosclerotic changes in the thoracic and abdominal aorta. No  cause for the patient's left arm symptoms identified. Recommend annual imaging followup by CTA or MRA. This recommendation follows 2010 ACCF/AHA/AATS/ACR/ASA/SCA/SCAI/SIR/STS/SVM Guidelines for the Diagnosis and Management of Patients with Thoracic Aortic Disease. Circulation. 2010; 121: V371-G626. Aortic aneurysm NOS (ICD10-I71.9) 2. Coronary artery calcifications most prominent in the left main and LAD. There appears to be more mild involvement of the right coronary artery. 3. Nonobstructive renal stones. A 13 mm stone is seen in the lower pole the right kidney. 4. There are a few jejunal loops which demonstrate prominence of the wall and possible thickening. This could be due to the lack of distention. In the appropriate clinical setting, enteritis could have a similar appearance. Recommend clinical correlation. 5. Small fat containing right inguinal hernia. 6. Chronic compression fracture of T12, mild. Electronically Signed   By: Dorise Bullion III M.D   On: 03/24/2019 15:07    Cardiac Studies   ECHO  1. The left ventricle has mildly reduced systolic function, with an ejection fraction of 45-50%. The cavity size was normal. Left ventricular diastolic Doppler parameters are consistent with impaired relaxation. There appears to be inferior and  inferolateral hypokinesis.  2. The right ventricle has normal systolic function. The cavity was normal. There is no increase in right ventricular wall thickness.  3. No evidence of mitral valve stenosis. Trivial mitral regurgitation.  4. The aortic valve is tricuspid. No stenosis of the aortic valve.  5. The aorta is normal in size and structure.  6. The IVC was normal in size. No complete TR doppler jet so unable to estimate PA systolic pressure.   Cath Conclusions: 1. 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 2. Normal left ventricular filling pressure.  Recommendations: 1. Medical therapy and risk factor  modification to prevent progression of mild CAD. 2. Optimize medical therapy for management of mildly reduced LVEF noted by echo, consistent with nonischemic cardiomyopathy.  Diagnostic Dominance: Right   Nelva Bush, MD  Patient Profile     58 y.o. male with atypical CP, reassuring Cath, Mildly reduced EF 45%, syncope  Assessment & Plan    Syncope  - possible vagal response, ?prior hypoglycemia  - no tele adverse findings  - Will place 30 day event monitor  - continue with hydration as outpatient  -Discussed yesterday abstaining from driving for 6 months.  -No heavy lifting with right arm, post radial catheterization for the next 3 days.  I would be comfortable with him resuming work on Friday.  Chest pain  - sharp  - reassuring cath  - risk factor modification  Mildly reduced EF  - continue coreg  - will trial low dose lisinopril 5mg  PO QD, watch for hypotension  - repeat ECHO in 3 months.   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 Okay for discharge  [facility] will sign off.   Medication Recommendations: Lisinopril 5 mg once a day Other recommendations (labs, testing, etc): We are going to set up a 30-day event monitor, echocardiogram in 3 months Follow up as an outpatient: We will set up a return visit.  For questions or updates, please contact [facility] Please consult [email or web address] for contact info under        Signed, Donato Schultz, MD  03/26/2019, 7:34 AM

## 2019-03-26 NOTE — Progress Notes (Signed)
Patient complaining of aching chest pain 4/10  Which pt. Stated started @ 0400. EKG done, V/s checked ( see flowsheet). Will monitor.

## 2019-03-26 NOTE — Discharge Summary (Signed)
Physician Discharge Summary  Patrick Curry ZOX:[id number] DOB: 1961/04/01 DOA: 03/24/2019  PCP: Patrick MapleGage, John F., MD  Admit date: 03/24/2019 Discharge date: 03/26/2019  Time spent: 40 minutes  Recommendations for Outpatient Follow-up:  1. Cardiology will contact patient to schedule follow up appointment as well as a 30-day event monitor 2. Follow up with PCP 1-2 weeks for evaluation of blood sugar control as medications changed due to hypoglycemia/syncope   Discharge Diagnoses:  Principal Problem:   Chest pain Active Problems:   CAD in native artery   Syncope and collapse   Type 2 diabetes mellitus without complication (HCC)   AAA (abdominal aortic aneurysm) (HCC)   History of CVA (cerebrovascular accident)   Discharge Condition: stable  Diet recommendation: heart healthy carb modified  Filed Weights   03/24/19 1846 03/25/19 0412 03/26/19 0634  Weight: 82.7 kg 83.1 kg 82.6 kg    History of present illness:  Patrick Curry is a 58 y.o. male with medical history significant of DM type II, hypertension, HLD, CVA without residual weakness, and DVT; who presented on 03/24/19 with complaints of chest pain and syncope. That morning while at work around 8:30 AM he reported having right arm numbness and sharp right-sided chest pain.  He reported having some lightheadedness before apparently losing consciousness. The next thing he recalled was being surrounded by firemen.  Per EMS he was noted to be unconscious with decreased respiratory rate.  Patient was bagged masked with 4 rescue breaths, and then reported to have come to.  No CPR was performed.  Patient denied having any shortness of breath, but still complained of some chest discomfort.  Denied any tobacco use, illicit drug use, abdominal pain, nausea, vomiting, or diaphoresis.  EMS noted that blood sugars were within normal limits and he was given 324 mg of aspirin in route by EMS.  He takes Plavix daily for the previous history of CVA.  Hospital  Course:   Atypical chest pain/ syncope: Concerning for cardiogenic etiology.Patient presented with complaints of right-sided chest pain arm numbness. Subsequently, reported to have had a syncopal event. CT angiogram negative for any signs of pulmonary embolus or dissection, but did show more calcifications in the left main,LAD,and mild more involvement in the right coronary artery. Not orthostatic. Echo with  EF 45% and consistent with nonischemic cardiomyopathy. He was hypoglycemic and this could contribute to syncope.  Evaluated by cards who opine pain is atypical sx with negative trop. Recommended cath which revealed mild, non-obstructive cad and recommended medical therapy and risk factor modification to prevent progression of mild CAD . UDS negative. Cardiology will arrange 30-day event monitor and contact patient for follow up visit. Recommending repeat echo 3 months   Coronary artery disease: see #1  Diabetes mellitus type 2 with hypoglycemia:Patient noted to have low blood sugars 45 on admission. A1c 7.2. reports he took meds that morning and did not eat. home meds include lantus, janumvia and metformin. Will discharge on metformin only due to hypoglycemia. Have instructed patient to monitor sugar at mealtime and bedtime and to document readings each day so PCP can evlauate in 1-2 weeks.  History of CVA: Patient reports pain status post TPA and has no residual deficits.Continue Plavix  AAA: 4.2 ascending thoracic aortic aneurysm noted on CTA that appears unchanged since 2013.  Essential hypertension. Fair control -Continue Coreg  Nephrolithiasis: Patient noted to have nonobstructing stones on CT angiogram. One noted to be 13 mm in the lower right pole. OP follow up  Hyperlipidemia. Lipid  panel unremarkable. Continue atorvastatin   Procedures: Cardiac cath 03/25/19 Consultations:  Dr Marlou Porch cardiology  Discharge Exam: Vitals:   03/26/19 0416 03/26/19 0825  BP:  128/74 127/76  Pulse: (!) 59 (!) 102  Resp: 20   Temp: 98.2 F (36.8 C)   SpO2: 96% 95%    General: awake alert no acute distress Cardiovascular: rrr no mgr no LE edema Respiratory: normal effort BS clear bilaterally no wheeze  Discharge Instructions   Discharge Instructions    Call MD for:  persistant dizziness or light-headedness   Complete by: As directed    Call MD for:  severe uncontrolled pain   Complete by: As directed    Call MD for:  temperature >100.4   Complete by: As directed    Diet - low sodium heart healthy   Complete by: As directed    Discharge instructions   Complete by: As directed    Take medications as prescribed No driving 6 months No heavy lifting with right arm for 3 days Check blood sugar at meals and at bedtime. Keep a journal of readings each day and take this to you PCP in 1-2 weeks for evaluation of blood sugar   Increase activity slowly   Complete by: As directed      Allergies as of 03/26/2019   No Known Allergies     Medication List    STOP taking these medications   insulin glargine 100 unit/mL Sopn Commonly known as: LANTUS   sitaGLIPtin 100 MG tablet Commonly known as: JANUVIA     TAKE these medications   atorvastatin 40 MG tablet Commonly known as: LIPITOR Take 40 mg by mouth daily.   carvedilol 6.25 MG tablet Commonly known as: COREG TAKE ONE TABLET BY MOUTH TWICE DAILY WITH FOOD   clopidogrel 75 MG tablet Commonly known as: PLAVIX Take 75 mg by mouth daily.   [employer] Probiotic Acidophilus 10 MG Caps Take 1 capsule by mouth daily.   cyanocobalamin 2000 MCG tablet Take 2,000 mcg by mouth daily.   folic acid 1 MG tablet Commonly known as: FOLVITE Take 1 mg by mouth daily.   lisinopril 5 MG tablet Commonly known as: ZESTRIL Take 1 tablet (5 mg total) by mouth daily.   metFORMIN 1000 MG tablet Commonly known as: GLUCOPHAGE Take 1,000 mg by mouth 2 (two) times daily with a meal.   montelukast 10 MG  tablet Commonly known as: SINGULAIR Take 10 mg by mouth daily.   nitroGLYCERIN 0.4 MG SL tablet Commonly known as: NITROSTAT   sildenafil 20 MG tablet Commonly known as: REVATIO Take 40-100 mg by mouth as needed.   sulfaSALAzine 500 MG tablet Commonly known as: AZULFIDINE Take 1,500 mg by mouth 2 (two) times daily.   Sure Comfort Pen Needles 31G X 8 MM Misc Generic drug: Insulin Pen Needle See admin instructions.   Vitamin D (Ergocalciferol) 1.25 MG (50000 UT) Caps capsule Commonly known as: DRISDOL Take 50,000 Units by mouth every 7 (seven) days. Mondays      No Known Allergies Follow-up Information    Ocie Doyne., MD. Go on 04/02/2019.   Specialty: Family Medicine Why: @9 :30am Contact information: [place] [address] [phone]            The results of significant diagnostics from this hospitalization (including imaging, microbiology, ancillary and laboratory) are listed below for reference.    Significant Diagnostic Studies: Dg Chest 2 View  Result Date: 03/24/2019 CLINICAL DATA:  Syncopal episode.  Chest pain. EXAM: CHEST - 2 VIEW COMPARISON:  06/19/2017 FINDINGS: Midline trachea. Normal heart size and mediastinal contours. Numerous leads and wires project over the chest. No pleural effusion or pneumothorax. Clear lungs. Probable nipple shadow projecting over the left lung base; CTA of the chest is pending. IMPRESSION: No acute cardiopulmonary disease. Electronically Signed   By: Jeronimo Greaves M.D.   On: 03/24/2019 14:10   Ct Angio Chest/abd/pel For Dissection W And/or Wo Contrast  Addendum Date: 03/26/2019   ADDENDUM REPORT: 03/26/2019 08:16 ADDENDUM: No pulmonary emboli are identified. Electronically Signed   By: Gerome Sam III M.D   On: 03/26/2019 08:16   Result Date: 03/26/2019 CLINICAL DATA:  Chest pain.  Right hand tingling. EXAM: CT ANGIOGRAPHY CHEST, ABDOMEN AND PELVIS TECHNIQUE: Multidetector CT imaging through the chest, abdomen  and pelvis was performed using the standard protocol during bolus administration of intravenous contrast. Multiplanar reconstructed images and MIPs were obtained and reviewed to evaluate the vascular anatomy. CONTRAST:  OMNIPAQUE IOHEXOL 350 MG/ML SOLN COMPARISON:  CT the abdomen and pelvis January 23, 2015. CT of the chest February 22, 2012. FINDINGS: CTA CHEST FINDINGS Cardiovascular: Coronary artery calcifications are seen in the left main and anterior descending coronary arteries. Probable mild coronary artery calcifications in the right coronary artery. The heart is unremarkable. Central pulmonary arteries are normal in caliber with no filling defects identified. Minimal atherosclerotic changes in the thoracic aorta. The ascending thoracic aorta measures up to 4.2 cm just proximal to the arch. This is not significantly changed, measuring 4.1 cm previously. The remainder of the thoracic aorta is normal in caliber with no other evidence of aneurysm. No dissection. The left subclavian artery is normal in appearance. The left carotid artery, right carotid artery, and right subclavian artery arise from a common trunk. There is mild atherosclerotic change in the left common carotid artery seen on image 1. The right common carotid artery is normal in appearance. Visualized portions of the right subclavian artery are normal in appearance. A portion is obscured by streak artifact off the right subclavian vein which is contrast filled. Visualized portions of the vertebral arteries are normal. Mediastinum/Nodes: No enlarged mediastinal, hilar, or axillary lymph nodes. Thyroid gland, trachea, and esophagus demonstrate no significant findings. Lungs/Pleura: A nodular density in the right lung on series 6, image 34 is unchanged since 2013, not requiring follow-up. No other abnormalities in the lungs. The central airways are normal. No pneumothorax. Musculoskeletal: See below. Review of the MIP images confirms the above  findings. CTA ABDOMEN AND PELVIS FINDINGS VASCULAR Aorta: Mild atherosclerotic changes seen in the nonaneurysmal abdominal aorta. No dissection. Celiac: Patent without evidence of aneurysm, dissection, vasculitis or significant stenosis. SMA: Patent without evidence of aneurysm, dissection, vasculitis or significant stenosis. Renals: Both renal arteries are patent without evidence of aneurysm, dissection, vasculitis, fibromuscular dysplasia or significant stenosis. IMA: Patent without evidence of aneurysm, dissection, vasculitis or significant stenosis. Iliac and femoral arteries: The iliac and femoral arteries are widely patent and well opacified with no aneurysm or dissection. Minimal atherosclerotic changes. Inflow: Patent without evidence of aneurysm, dissection, vasculitis or significant stenosis. Veins: No obvious venous abnormality within the limitations of this arterial phase study. Review of the MIP images confirms the above findings. NON-VASCULAR Hepatobiliary: Evaluation is limited due to arterial phase imaging. Within this limitation, no abnormalities are identified. The patient is status post cholecystectomy. Pancreas: Unremarkable. No pancreatic ductal dilatation or surrounding inflammatory changes. Spleen: Normal in size without focal abnormality. Adrenals/Urinary Tract: Bilateral  renal stones are identified. The largest stone is seen on the right measuring up to 13 mm. No hydronephrosis or perinephric stranding identified. The ureters and bladder are normal. Stomach/Bowel: A few jejunal loops appear somewhat thick walled such as on axial image 181 of series 7. No adjacent stranding. No obstruction identified. The remainder of the small bowel is normal. There is moderate fecal loading in the colon. The colon is otherwise normal. The appendix is not seen but there is no secondary evidence of appendicitis. Lymphatic: No adenopathy is identified. Reproductive: Prostate is unremarkable. Other: There is a  fat containing right inguinal hernia which is small. There is a small periumbilical fat containing hernia hernia. No free air free fluid. Musculoskeletal: Mild anterior wedging of T12 is stable. No other bony abnormalities are identified. Probable previous sternal fracture. Review of the MIP images confirms the above findings. IMPRESSION: 1. 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 No dissection in the thoracic or abdominal aorta. Mild atherosclerotic changes in the thoracic and abdominal aorta. No cause for the patient's left arm symptoms identified. Recommend annual imaging followup by CTA or MRA. This recommendation follows 2010 ACCF/AHA/AATS/ACR/ASA/SCA/SCAI/SIR/STS/SVM Guidelines for the Diagnosis and Management of Patients with Thoracic Aortic Disease. Circulation. 2010; 121: Z610-R604: E266-e369. Aortic aneurysm NOS (ICD10-I71.9) 2. Coronary artery calcifications most prominent in the left main and LAD. There appears to be more mild involvement of the right coronary artery. 3. Nonobstructive renal stones. A 13 mm stone is seen in the lower pole the right kidney. 4. There are a few jejunal loops which demonstrate prominence of the wall and possible thickening. This could be due to the lack of distention. In the appropriate clinical setting, enteritis could have a similar appearance. Recommend clinical correlation. 5. Small fat containing right inguinal hernia. 6. Chronic compression fracture of T12, mild. Electronically Signed: By: Gerome Samavid  Williams III M.D On: 03/24/2019 15:07    Microbiology: Recent Results (from the past 240 hour(s))  SARS Coronavirus 2 [facility](Hospital order, Performed in [facility]) Nasopharyngeal Nasopharyngeal Swab     Status: None   Collection Time: 03/24/19  4:38 PM   Specimen: Nasopharyngeal Swab  Result Value Ref Range Status   SARS Coronavirus 2 NEGATIVE NEGATIVE Final    Comment: (NOTE) If result is NEGATIVE SARS-CoV-2  target nucleic acids are NOT DETECTED. The SARS-CoV-2 RNA is generally detectable in upper and lower  respiratory specimens during the acute phase of infection. The lowest  concentration of SARS-CoV-2 viral copies this assay can detect is 250  copies / mL. A negative result does not preclude SARS-CoV-2 infection  and should not be used as the sole basis for treatment or other  patient management decisions.  A negative result may occur with  improper specimen collection / handling, submission of specimen other  than nasopharyngeal swab, presence of viral mutation(s) within the  areas targeted by this assay, and inadequate number of viral copies  (<250 copies / mL). A negative result must be combined with clinical  observations, patient history, and epidemiological information. If result is POSITIVE SARS-CoV-2 target nucleic acids are DETECTED. The SARS-CoV-2 RNA is generally detectable in upper and lower  respiratory specimens dur ing the acute phase of infection.  Positive  results are indicative of active infection with SARS-CoV-2.  Clinical  correlation with patient history and other diagnostic information is  necessary to determine patient infection status.  Positive results do  not rule out bacterial infection or co-infection  with other viruses. If result is PRESUMPTIVE POSTIVE SARS-CoV-2 nucleic acids MAY BE PRESENT.   A presumptive positive result was obtained on the submitted specimen  and confirmed on repeat testing.  While 2019 novel coronavirus  (SARS-CoV-2) nucleic acids may be present in the submitted sample  additional confirmatory testing may be necessary for epidemiological  and / or clinical management purposes  to differentiate between  SARS-CoV-2 and other Sarbecovirus currently known to infect humans.  If clinically indicated additional testing with an alternate test  methodology [phone]([id number]) is advised. The SARS-CoV-2 RNA is generally  detectable in upper and lower  respiratory sp ecimens during the acute  phase of infection. The expected result is Negative. Fact Sheet for Patients:  BoilerBrush.com.cyhttps://www.fda.gov/media/136312/download Fact Sheet for Healthcare Providers: https://pope.com/https://www.fda.gov/media/136313/download This test is not yet approved or cleared by the [place] FDA and has been authorized for detection and/or diagnosis of SARS-CoV-2 by FDA under an Emergency Use Authorization (EUA).  This EUA will remain in effect (meaning this test can be used) for the duration of the COVID-19 declaration under Section 564(b)(1) of the Act, 21 U.S.C. section 360bbb-3(b)(1), unless the authorization is terminated or revoked sooner. Performed at [facility], [address] [place], [place] [id number]      Labs: Basic Metabolic Panel: Recent Labs  Lab 03/24/19 1223 03/26/19 0347  NA 140 136  K 3.9 3.8  CL 106 104  CO2 23 23  GLUCOSE 118* 251*  BUN 14 18  CREATININE 1.01 0.95  CALCIUM 9.2 8.5*   Liver Function Tests: No results for input(s): AST, ALT, ALKPHOS, BILITOT, PROT, ALBUMIN in the last 168 hours. No results for input(s): LIPASE, AMYLASE in the last 168 hours. No results for input(s): AMMONIA in the last 168 hours. CBC: Recent Labs  Lab 03/24/19 1330 03/26/19 0347  WBC 6.0 7.4  NEUTROABS 3.3  --   HGB 14.3 14.9  HCT 40.5 42.2  MCV 83.9 83.9  PLT 242 251   Cardiac Enzymes: No results for input(s): CKTOTAL, CKMB, CKMBINDEX, TROPONINI in the last 168 hours. BNP: BNP (last 3 results) No results for input(s): BNP in the last 8760 hours.  ProBNP (last 3 results) No results for input(s): PROBNP in the last 8760 hours.  CBG: Recent Labs  Lab 03/25/19 1716 03/25/19 2102 03/26/19 0307 03/26/19 0820 03/26/19 1146  GLUCAP 165* 279* 238* 165* 200*       Signed:  Gwenyth BenderBLACK, M NP.  [facility] 03/26/2019, 12:28 PM

## 2019-03-27 ENCOUNTER — Telehealth: Payer: Self-pay | Admitting: Radiology

## 2019-03-27 NOTE — Telephone Encounter (Signed)
Enrolled patient for a 30 Day Preventice Event monitor to be mailed. Brief instructions were gone over with the patient and he knows to expect the monitor to arrive in 3-4 days 

## 2019-04-02 DIAGNOSIS — G459 Transient cerebral ischemic attack, unspecified: Secondary | ICD-10-CM | POA: Diagnosis not present

## 2019-04-02 DIAGNOSIS — E785 Hyperlipidemia, unspecified: Secondary | ICD-10-CM | POA: Diagnosis not present

## 2019-04-02 DIAGNOSIS — E118 Type 2 diabetes mellitus with unspecified complications: Secondary | ICD-10-CM | POA: Diagnosis not present

## 2019-04-02 DIAGNOSIS — R55 Syncope and collapse: Secondary | ICD-10-CM | POA: Diagnosis not present

## 2019-04-02 DIAGNOSIS — E1140 Type 2 diabetes mellitus with diabetic neuropathy, unspecified: Secondary | ICD-10-CM | POA: Diagnosis not present

## 2019-04-02 NOTE — Progress Notes (Signed)
Cardiology Office Note:    Date:  04/03/2019   ID:  Patrick Curry, DOB Feb 01, 1961, MRN [id number]  PCP:  Guadalupe MapleGage, John F., MD  Cardiologist:  Norman HerrlichBrian Carlosdaniel Grob, MD    Referring MD: Guadalupe MapleGage, John F., MD    ASSESSMENT:    1. Syncope and collapse   2. Hypoglycemia associated with diabetes (HCC)   3. Mild CAD   4. LV dysfunction   5. Hypercholesterolemia   6. Enlarged thoracic aorta (HCC)    PLAN:    In order of problems listed above:  1. Syncope - No recurrence. When retelling the event that initiated his hospital stay tells me his eyes were closed but he could hear what was going on around him - does not sound like true syncope. Tells me he doesn't get low blood sugar readings at home, but did have while hospitalized as low as 33 on my review. He reports sometimes feeling "off" at work and I believe these may be hypoglycemic episodes. Works a very physically demanding job. Will complete 30 day monitor to rule out arrhthymias etiology. 30 day monitor placed in the office today.   2. Mild CAD - Stable. Cath 03/25/19 with mild non-obstructive CAD to ostial LAD and mid RCA. No anginal symptoms - no chest pain, no DOE. Continue GDMT plavix, statin, beta blocker.    3. LV dysfunction - Echo 03/25/19 EF 45-50% and impaired relaxation. No edema, SOB. Will plan to repeat echocardiogram in 3 months. Continue GDMT beta blocker, ACE.   4. Enlarged thoracic aorta - CT 03/24/19 Mild aneurysmal dilatation of the ascending thoracic aorta measuring 4.2 cm, similar since 2013.Recommend annual imaging followup by CTA or MRA. Will plan for repeat CTA 04/2019. Emphasized BP control - BP under good control today.   5. HLD - LDL 43 on 03/25/19. Continue Lipitor. Denies myalgias.   6. DM2 - Concern hypoglycemia may have contributed to syncope. Follows with his PCP Dr. Samuel GermanyGage. Recently DC'd Januvia and decreased Lantus to 40. Was on Lantus 80 at time of his event. I have asked him to check CBGs mid day and while at work.     Next appointment: 3 months with echo 1 week prior   Medication Adjustments/Labs and Tests Ordered: Current medicines are reviewed at length with the patient today.  Concerns regarding medicines are outlined above.  Orders Placed This Encounter  Procedures  . ECHOCARDIOGRAM COMPLETE   No orders of the defined types were placed in this encounter.   Chief Complaint  Patient presents with  . Follow-up    after  . Loss of Consciousness    History of Present Illness:    Patrick CoyerRalph Curry is a 58 y.o. male with a hx of CAD, HTN, HLD, DVT, syncope, DM2, abdominal aortic aneurysm, CVA last seen as an inpatient. He was admitted 03/24/19 for chest pain and syncope. Ct angiogram without PE but noted more calcifications. Echo EF 45% consistent with NICM. Note to be hypoglycemic. He underwent LHC which showed mild non obstructive CAD. He was recommended a 30 day monitor and repeat echo in 3 months.  I reviewed the cardiology records there was concern that he had symptomatic hypoglycemia recommended for 30-day event monitor started on low-dose lisinopril with mildly reduced ejection fraction and mild nonobstructive CAD and a recommendation to abstain from driving for 6 months as well as being able to return to work after 03/29/2019  Reports gradually regaining strength after hospitalization. Was seen by his PCP today who adjusted his DM2  medications. Concern that hypoglycemia was cause of his syncopal event. In discussion, tells me that during the event he was aware of what was going on around him and could hear people, but eyes were closed and could not see. Low suspicion seizure as he reports no involuntary movement, no biting of tongue. As not true syncope, low suspicion cardiac etiology. He brings his 30 day monitor to the office today and we will help him to place it.   Reports no chest pain, no shortness of breath, no DOE. Blood sugars at home have been >100 when checked. Dr. Micheal Likens recently DC'd Januvia and  put him on Lantus 40. He was on Lantus 80 when he had his event. A1c 7.2 on 03/24/19.   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 Very strenuous work.   Compliance with diet, lifestyle and medications: Yes Past Medical History:  Diagnosis Date  . Asthma 06/20/2017  . Carotid stenosis 10/08/2015  . Diarrhea 10/08/2015  . DVT (deep venous thrombosis) ([place])   . Hemorrhagic colitis 02/12/2015  . Hypercholesterolemia 06/20/2017  . Hypertension associated with type 2 diabetes mellitus ([place])   . IBD (inflammatory bowel disease) 10/08/2015  . Ketoacidosis 02/12/2015  . Kidney stones 06/20/2017  . Migraine 03/25/2014  . Nonischemic cardiomyopathy ([place]) 03/26/2019   per cath 03/25/19  . Severe sepsis ([place]) 02/12/2015  . Stroke ([place]) 03/25/2014  . Type 2 diabetes mellitus without complication ([place]) 3/53/6144    Past Surgical History:  Procedure Laterality Date  . CHOLECYSTECTOMY    . COLON RESECTION     Partial, after trauma  . IVC FILTER INSERTION  2011  . LEFT HEART CATH AND CORONARY ANGIOGRAPHY N/A 03/25/2019   Procedure: LEFT HEART CATH AND CORONARY ANGIOGRAPHY;  Surgeon: Nelva Bush, MD;  Location: [facility];  Service: Cardiovascular;  Laterality: N/A;    Current Medications: Current Meds  Medication Sig  . atorvastatin (LIPITOR) 40 MG tablet Take 40 mg by mouth daily.  . carvedilol (COREG) 6.25 MG tablet TAKE ONE TABLET BY MOUTH TWICE DAILY WITH FOOD  . clopidogrel (PLAVIX) 75 MG tablet Take 75 mg by mouth daily.  . cyanocobalamin 2000 MCG tablet Take 2,000 mcg by mouth daily.   . folic acid (FOLVITE) 1 MG tablet Take 1 mg by mouth daily.   Marland Kitchen gabapentin (NEURONTIN) 800 MG tablet Take 800 mg by mouth daily as needed.  . Lactobacillus (CVS PROBIOTIC ACIDOPHILUS) 10 MG CAPS Take 1 capsule by mouth daily.   Marland Kitchen lisinopril (ZESTRIL) 5 MG tablet Take 1 tablet (5 mg total) by mouth daily.  . metFORMIN (GLUCOPHAGE) 1000 MG tablet Take 1,000 mg by  mouth 2 (two) times daily with a meal.   . montelukast (SINGULAIR) 10 MG tablet Take 10 mg by mouth daily.   . nitroGLYCERIN (NITROSTAT) 0.4 MG SL tablet Place 0.4 mg under the tongue every 5 (five) minutes as needed for chest pain.   . sildenafil (REVATIO) 20 MG tablet Take 40-100 mg by mouth as needed.   . sulfaSALAzine (AZULFIDINE) 500 MG tablet Take 1,500 mg by mouth 2 (two) times daily.   Haig Prophet COMFORT PEN NEEDLES 31G X 8 MM MISC See admin instructions.  . traMADol (ULTRAM) 50 MG tablet Take 50 mg by mouth daily as needed.  . Vitamin D, Ergocalciferol, (DRISDOL) 50000 units CAPS capsule Take 50,000 Units by mouth every 7 (seven) days. Mondays     Allergies:   Patient has no known  allergies.   Social History   Socioeconomic History  . Marital status: Married    Spouse name: Not on file  . Number of children: Not on file  . Years of education: Not on file  . Highest education level: Not on file  Occupational History  . Occupation: Set designer, some Conservation officer, historic buildings: [employer]  Social Needs  . Financial resource strain: Not on file  . Food insecurity    Worry: Not on file    Inability: Not on file  . Transportation needs    Medical: Not on file    Non-medical: Not on file  Tobacco Use  . Smoking status: Never Smoker  . Smokeless tobacco: Never Used  Substance and Sexual Activity  . Alcohol use: No    Frequency: Never  . Drug use: No  . Sexual activity: Not on file  Lifestyle  . Physical activity    Days per week: Not on file    Minutes per session: Not on file  . Stress: Not on file  Relationships  . Social Musician on phone: Not on file    Gets together: Not on file    Attends religious service: Not on file    Active member of club or organization: Not on file    Attends meetings of clubs or organizations: Not on file    Relationship status: Not on file  Other Topics Concern  . Not on file  Social History Narrative   Lives  in [place] with wife and disabled son.     Family History: The patient's family history includes Diabetes in his mother; Diabetes (age of onset: 78) in his father; Heart disease (age of onset: 66) in his mother; Hypertension in his mother. ROS:   Please see the history of present illness.    All other systems reviewed and are negative.  EKGs/Labs/Other Studies Reviewed:    The following studies were reviewed today:  Echo 03/25/19  1. The left ventricle has mildly reduced systolic function, with an ejection fraction of 45-50%. The cavity size was normal. Left ventricular diastolic Doppler parameters are consistent with impaired relaxation. There appears to be inferior and  inferolateral hypokinesis.  2. The right ventricle has normal systolic function. The cavity was normal. There is no increase in right ventricular wall thickness.  3. No evidence of mitral valve stenosis. Trivial mitral regurgitation.  4. The aortic valve is tricuspid. No stenosis of the aortic valve.  5. The aorta is normal in size and structure.  6. The IVC was normal in size. No complete TR doppler jet so unable to estimate PA systolic pressure.  LHC 03/25/19 Conclusions: 1. 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 2. Normal left ventricular filling pressure.   Recommendations: 1. Medical therapy and risk factor modification to prevent progression of mild CAD. 2. Optimize medical therapy for management of mildly reduced LVEF noted by echo, consistent with nonischemic cardiomyopathy   EKG:  No EKG today.  Recent Labs: 03/25/2019: TSH 1.150 03/26/2019: BUN 18; Creatinine, Ser 0.95; Hemoglobin 14.9; Platelets 251; Potassium 3.8; Sodium 136  Recent Lipid Panel    Component Value Date/Time   CHOL 110 03/25/2019 0558   TRIG 96 03/25/2019 0558   HDL 48 03/25/2019 0558   CHOLHDL 2.3 03/25/2019 0558   VLDL 19 03/25/2019 0558   LDLCALC 43 03/25/2019 0558    Physical  Exam:    VS:  BP 124/62 (BP  Location: Right Arm, Patient Position: Sitting, Cuff Size: Normal)   Pulse 80   Ht 6' (1.829 m)   Wt 180 lb 9.6 oz (81.9 kg)   SpO2 96%   BMI 24.49 kg/m     Wt Readings from Last 3 Encounters:  04/03/19 180 lb 9.6 oz (81.9 kg)  03/26/19 182 lb (82.6 kg)  07/20/17 177 lb (80.3 kg)     GEN:  Well nourished, well developed in no acute distress HEENT: Normal NECK: No JVD; No carotid bruits LYMPHATICS: No lymphadenopathy CARDIAC: RRR, no murmurs, rubs, gallops RESPIRATORY:  Clear to auscultation without rales, wheezing or rhonchi  ABDOMEN: Soft, non-tender, non-distended MUSCULOSKELETAL:  No edema; No deformity  SKIN: Warm and dry NEUROLOGIC:  Alert and oriented x 3 PSYCHIATRIC:  Normal affect    Signed, Norman HerrlichBrian Euphemia Lingerfelt, MD  04/03/2019 2:45 PM    [facility]

## 2019-04-03 ENCOUNTER — Encounter: Payer: Self-pay | Admitting: Cardiology

## 2019-04-03 ENCOUNTER — Ambulatory Visit (INDEPENDENT_AMBULATORY_CARE_PROVIDER_SITE_OTHER): Payer: BC Managed Care – PPO | Admitting: Cardiology

## 2019-04-03 ENCOUNTER — Other Ambulatory Visit: Payer: Self-pay

## 2019-04-03 ENCOUNTER — Ambulatory Visit (INDEPENDENT_AMBULATORY_CARE_PROVIDER_SITE_OTHER): Payer: BC Managed Care – PPO

## 2019-04-03 VITALS — BP 124/62 | HR 80 | Ht 72.0 in | Wt 180.6 lb

## 2019-04-03 DIAGNOSIS — R55 Syncope and collapse: Secondary | ICD-10-CM | POA: Diagnosis not present

## 2019-04-03 DIAGNOSIS — I519 Heart disease, unspecified: Secondary | ICD-10-CM

## 2019-04-03 DIAGNOSIS — I7789 Other specified disorders of arteries and arterioles: Secondary | ICD-10-CM

## 2019-04-03 DIAGNOSIS — I2510 Atherosclerotic heart disease of native coronary artery without angina pectoris: Secondary | ICD-10-CM | POA: Diagnosis not present

## 2019-04-03 DIAGNOSIS — E11649 Type 2 diabetes mellitus with hypoglycemia without coma: Secondary | ICD-10-CM

## 2019-04-03 DIAGNOSIS — E7800 Pure hypercholesterolemia, unspecified: Secondary | ICD-10-CM

## 2019-04-03 NOTE — Patient Instructions (Addendum)
Medication Instructions:   No medication changes today.   Recommend taking your Lisinopril 5mg  before bed.   If you need a refill on your cardiac medications before your next appointment, please call your pharmacy.   Lab work: None ordered today.  If you have labs (blood work) drawn today and your tests are completely normal, you will receive your results only by: Marland Kitchen MyChart Message (if you have MyChart) OR . A paper copy in the mail If you have any lab test that is abnormal or we need to change your treatment, we will call you to review the results.  Testing/Procedures: Your 30 day monitor was placed today.  We will plan to do an echocardiogram in approximately 3 months to recheck your heart's pumping function.   Your physician has requested that you have an echocardiogram. Echocardiography is a painless test that uses sound waves to create images of your heart. It provides your doctor with information about the size and shape of your heart and how well your heart's chambers and valves are working. This procedure takes approximately one hour. There are no restrictions for this procedure.  Follow-Up: At [facility], you and your health needs are our priority.  As part of our continuing mission to provide you with exceptional heart care, we have created designated Provider Care Teams.  These Care Teams include your primary Cardiologist (physician) and Advanced Practice Providers (APPs -  Physician Assistants and Nurse Practitioners) who all work together to provide you with the care you need, when you need it. . You will need a follow up appointment in 3 months.   Any Other Special Instructions Will Be Listed Below (If Applicable).   Please check some blood sugars in the late morning, approximately 10am or 11am.  Please take your glucometer (blood sugar monitor) to work and check during your breaks at work.  It sounds as if you had a low blood sugar that caused your episode at  work.   We would want blood sugars to be >70 while at work.   Continue to follow closely with Dr. Micheal Likens

## 2019-04-11 DIAGNOSIS — Z6823 Body mass index (BMI) 23.0-23.9, adult: Secondary | ICD-10-CM | POA: Diagnosis not present

## 2019-04-11 DIAGNOSIS — W57XXXA Bitten or stung by nonvenomous insect and other nonvenomous arthropods, initial encounter: Secondary | ICD-10-CM | POA: Diagnosis not present

## 2019-04-16 DIAGNOSIS — R5383 Other fatigue: Secondary | ICD-10-CM | POA: Diagnosis not present

## 2019-04-16 DIAGNOSIS — E785 Hyperlipidemia, unspecified: Secondary | ICD-10-CM | POA: Diagnosis not present

## 2019-04-16 DIAGNOSIS — I2510 Atherosclerotic heart disease of native coronary artery without angina pectoris: Secondary | ICD-10-CM | POA: Diagnosis not present

## 2019-04-16 DIAGNOSIS — E118 Type 2 diabetes mellitus with unspecified complications: Secondary | ICD-10-CM | POA: Diagnosis not present

## 2019-04-29 DIAGNOSIS — Z6823 Body mass index (BMI) 23.0-23.9, adult: Secondary | ICD-10-CM | POA: Diagnosis not present

## 2019-04-29 DIAGNOSIS — E559 Vitamin D deficiency, unspecified: Secondary | ICD-10-CM | POA: Diagnosis not present

## 2019-04-29 DIAGNOSIS — K210 Gastro-esophageal reflux disease with esophagitis: Secondary | ICD-10-CM | POA: Diagnosis not present

## 2019-04-29 DIAGNOSIS — E785 Hyperlipidemia, unspecified: Secondary | ICD-10-CM | POA: Diagnosis not present

## 2019-04-29 DIAGNOSIS — E118 Type 2 diabetes mellitus with unspecified complications: Secondary | ICD-10-CM | POA: Diagnosis not present

## 2019-05-07 ENCOUNTER — Other Ambulatory Visit: Payer: Self-pay

## 2019-05-07 DIAGNOSIS — I480 Paroxysmal atrial fibrillation: Secondary | ICD-10-CM

## 2019-05-07 HISTORY — DX: Paroxysmal atrial fibrillation: I48.0

## 2019-06-05 ENCOUNTER — Ambulatory Visit (INDEPENDENT_AMBULATORY_CARE_PROVIDER_SITE_OTHER): Payer: BC Managed Care – PPO | Admitting: Family

## 2019-06-05 ENCOUNTER — Encounter: Payer: Self-pay | Admitting: Family

## 2019-06-05 ENCOUNTER — Other Ambulatory Visit: Payer: Self-pay

## 2019-06-05 VITALS — BP 108/58 | HR 77 | Temp 97.4°F | Ht 71.0 in | Wt 182.0 lb

## 2019-06-05 DIAGNOSIS — I491 Atrial premature depolarization: Secondary | ICD-10-CM

## 2019-06-05 DIAGNOSIS — I10 Essential (primary) hypertension: Secondary | ICD-10-CM

## 2019-06-05 DIAGNOSIS — I480 Paroxysmal atrial fibrillation: Secondary | ICD-10-CM

## 2019-06-05 DIAGNOSIS — E782 Mixed hyperlipidemia: Secondary | ICD-10-CM | POA: Insufficient documentation

## 2019-06-05 DIAGNOSIS — R55 Syncope and collapse: Secondary | ICD-10-CM

## 2019-06-05 DIAGNOSIS — I7789 Other specified disorders of arteries and arterioles: Secondary | ICD-10-CM | POA: Insufficient documentation

## 2019-06-05 DIAGNOSIS — E785 Hyperlipidemia, unspecified: Secondary | ICD-10-CM

## 2019-06-05 DIAGNOSIS — I2510 Atherosclerotic heart disease of native coronary artery without angina pectoris: Secondary | ICD-10-CM | POA: Diagnosis not present

## 2019-06-05 DIAGNOSIS — Z7901 Long term (current) use of anticoagulants: Secondary | ICD-10-CM

## 2019-06-05 MED ORDER — APIXABAN 5 MG PO TABS: 5.0000 mg | ORAL_TABLET | Freq: Two times a day (BID) | ORAL | 2 refills | Status: AC

## 2019-06-05 NOTE — Progress Notes (Signed)
Office Visit    Patient Name: Patrick Curry Date of Encounter: 06/05/2019  Primary Care Provider:  Ocie Doyne., MD Primary Cardiologist:  Shirlee More, MD Electrophysiologist:  None   Chief Complaint    Patrick Curry is a 58 y.o. male with a hx of syncope, HTN, PAC, PAF, DM2 presents today for follow up after 30 day monitor.   Past Medical History    Past Medical History:  Diagnosis Date  . Asthma 06/20/2017  . Carotid stenosis 10/08/2015  . Diarrhea 10/08/2015  . DVT (deep venous thrombosis) ([place])   . Hemorrhagic colitis 02/12/2015  . Hypercholesterolemia 06/20/2017  . Hypertension associated with type 2 diabetes mellitus ([place])   . IBD (inflammatory bowel disease) 10/08/2015  . Ketoacidosis 02/12/2015  . Kidney stones 06/20/2017  . Migraine 03/25/2014  . Nonischemic cardiomyopathy ([place]) 03/26/2019   per cath 03/25/19  . Severe sepsis ([place]) 02/12/2015  . Stroke ([employer]) 03/25/2014  . Type 2 diabetes mellitus without complication ([place]) 6/60/6301   Past Surgical History:  Procedure Laterality Date  . CHOLECYSTECTOMY    . COLON RESECTION     Partial, after trauma  . IVC FILTER INSERTION  2011  . LEFT HEART CATH AND CORONARY ANGIOGRAPHY N/A 03/25/2019   Procedure: LEFT HEART CATH AND CORONARY ANGIOGRAPHY;  Surgeon: Nelva Bush, MD;  Location: [facility];  Service: Cardiovascular;  Laterality: N/A;    Allergies  No Known Allergies  History of Present Illness    Patrick Curry is a 58 y.o. male with a hx of syncope, mild nonobstructive CAD, LV dysfunction EF 45-50%, enlarged thoracic aorta 4.2cm on 03/2019, HLD, DM2, PAF, PACs, CVA last seen 04/03/19 by Dr. Bettina Gavia.  Admitted 03/24/19 for chest pain and syncope. CTA without PE but noted calcification. Echo EF 45% consistent with NICM. Noted hypoglycemia episodes. Underwent LHC finding mild nonobstructive CAD. Recommended 30 day monitor and repeat echo in 3 months.   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 Reports some fatigue at work. No recurrent syncopal episodes.  Reports no chest pain, no SOB, no DOE. Follows every 3 months with his PCP Dr. Micheal Likens.   Tells me he has intermittent palpitations. Not associated with pain nor shortness of breath. We discussed his monitor results. Discussed PACs and that they are benign, but often bothersome. He reports no smoking, no alcohol, no proarrthymic medications. We discussed stress as a trigger and he endorses his work is often stressful.   Discussed PAF in depth and the need for anticoagulation for prevention of stroke. He has had a CVA in the past, is diabetic, and with HTN giving him a CHADS2VASC score of 4. He is agreeable to transition from Plavix to Eliquis.  EKGs/Labs/Other Studies Reviewed:   The following studies were reviewed today:  30 day monitor 04/2019 The predominant rhythm was sinus with minimum average and maximum heart rates of 50, 75 and 153 bpm.  Atrial fibrillation occurred single episode less than 1% total 11 minutes with average heart rate of 104 bpm.   There was one critical event of syncope associated with sinus rhythm and frequent APCs there was no pauses or episode of sinus node or AV block.   There were 47 stable events 16 of them associated with APCs one with PVCs.  There was a notation of accelerated junctional idioventricular rhythm which represented ectopic atrial rhythm.   There were no episodes of ventricular tachycardia or atrial flutter.  Conclusion, paroxysmal atrial fibrillation with a low burden less than 1% as well as frequent atrial premature beats.  A syncopal episode occurred not associated with an arrhythmia such as sinus node or AV nodal block.  Echo 03/25/19  1. The left ventricle has mildly reduced systolic function, with an ejection fraction of 45-50%. The cavity size was normal. Left ventricular diastolic Doppler parameters are consistent with impaired relaxation. There appears  to be inferior and  inferolateral hypokinesis.  2. The right ventricle has normal systolic function. The cavity was normal. There is no increase in right ventricular wall thickness.  3. No evidence of mitral valve stenosis. Trivial mitral regurgitation.  4. The aortic valve is tricuspid. No stenosis of the aortic valve.  5. The aorta is normal in size and structure.  6. The IVC was normal in size. No complete TR doppler jet so unable to estimate PA systolic pressure.   LHC 03/25/19 Conclusions: 1. 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 2. Normal left ventricular filling pressure.   Recommendations: 1. Medical therapy and risk factor modification to prevent progression of mild CAD. 2. Optimize medical therapy for management of mildly reduced LVEF noted by echo, consistent with nonischemic cardiomyopathy    EKG:  EKG is ordered today. The ekg ordered today demonstrates SR rate 79bpm with PACs in bigeminy pattern.    Recent Labs: 04/2019 at PCP: TSH 2.68, Hb 16, Creatinine 0.94 03/25/2019: TSH 1.150 03/26/2019: BUN 18; Creatinine, Ser 0.95; Hemoglobin 14.9; Platelets 251; Potassium 3.8; Sodium 136  Recent Lipid Panel    Component Value Date/Time   CHOL 110 03/25/2019 0558   TRIG 96 03/25/2019 0558   HDL 48 03/25/2019 0558   CHOLHDL 2.3 03/25/2019 0558   VLDL 19 03/25/2019 0558   LDLCALC 43 03/25/2019 0558    Home Medications   Current Meds  Medication Sig  . atorvastatin (LIPITOR) 40 MG tablet Take 40 mg by mouth daily.  . carvedilol (COREG) 6.25 MG tablet TAKE ONE TABLET BY MOUTH TWICE DAILY WITH FOOD  . cyanocobalamin 2000 MCG tablet Take 2,000 mcg by mouth daily.   . folic acid (FOLVITE) 1 MG tablet Take 1 mg by mouth daily.   Marland Kitchen gabapentin (NEURONTIN) 800 MG tablet Take 800 mg by mouth daily as needed.  . Lactobacillus (CVS PROBIOTIC ACIDOPHILUS) 10 MG CAPS Take 1 capsule by mouth daily.   Marland Kitchen lisinopril (ZESTRIL) 5 MG tablet Take 1  tablet (5 mg total) by mouth daily.  . metFORMIN (GLUCOPHAGE) 1000 MG tablet Take 1,000 mg by mouth 2 (two) times daily with a meal.   . montelukast (SINGULAIR) 10 MG tablet Take 10 mg by mouth daily.   . nitroGLYCERIN (NITROSTAT) 0.4 MG SL tablet Place 0.4 mg under the tongue every 5 (five) minutes as needed for chest pain.   Marland Kitchen sulfaSALAzine (AZULFIDINE) 500 MG tablet Take 1,500 mg by mouth 2 (two) times daily.   Ronette Deter COMFORT PEN NEEDLES 31G X 8 MM MISC See admin instructions.  . Vitamin D, Ergocalciferol, (DRISDOL) 50000 units CAPS capsule Take 50,000 Units by mouth every 7 (seven) days. Mondays  . [DISCONTINUED] clopidogrel (PLAVIX) 75 MG tablet Take 75 mg by mouth daily.      Review of Systems       Review of Systems  Constitution: Positive for malaise/fatigue. Negative for chills and fever.  Cardiovascular: Positive for palpitations. Negative for chest pain, dyspnea on exertion, leg swelling, near-syncope and syncope.  Respiratory: Negative for  cough, shortness of breath and wheezing.   Gastrointestinal: Negative for nausea and vomiting.  Neurological: Negative for dizziness, light-headedness and weakness.   All other systems reviewed and are otherwise negative except as noted above.  Physical Exam    VS:  BP (!) 108/58 (BP Location: Left Arm, Patient Position: Sitting)   Pulse 77   Temp (!) 97.4 F (36.3 C) (Temporal)   Ht 5\' 11"  (1.803 m)   Wt 182 lb (82.6 kg)   SpO2 98%   BMI 25.38 kg/m  , BMI Body mass index is 25.38 kg/m. GEN: Well nourished, well developed, in no acute distress. HEENT: normal. Neck: Supple, no JVD, carotid bruits, or masses. Cardiac: RRR, no murmurs, rubs, or gallops. No clubbing, cyanosis, edema.  Radials/DP/PT 2+ and equal bilaterally.  Respiratory:  Respirations regular and unlabored, clear to auscultation bilaterally. GI: Soft, nontender, nondistended, BS + x 4. MS: No deformity or atrophy. Skin: Warm and dry, no rash. Neuro:  Strength and  sensation are intact. Psych: Normal affect.  Accessory Clinical Findings    ECG personally reviewed by me today - SR rate 79pbm with PAC in pattern of bigeminy - no acute changes.  Assessment & Plan    1. Syncope - No recurrence. Hospital stay 03/2019. Likely hypoglycemic episode as glucose as low as 33 in hospital. 30 day monitor with no pauses - did note PAC and 1% burden PAF. No further cardiac eval of syncope.   2. Mild CAD - Stable. Cath 03/25/19 with mild non-obstructive CAD to ostial LAD and mid RCA. No anginal symptoms. Continue GDMT statin, beta blocker. No aspirin secondary to anticoagulation, as below. Reassurance provided regarding his cardiac catheterization results.  3. PAF - 30 day monitor results 05/07/19 shows single episode PAF less than 1% with 11 min and HR 104 bpm. Continue Carvedilol for rate control. Educated on PAF and subsequent stroke risk, agreeable to start Eliquis 5mg  BID.    4. LV Dysfunction - Echo 03/25/19 EF 45-50% and impaired relaxation. No edema, no SOB. Upcoming echocardiogram 06/2019 for reassessment. Continue GDMT beta blocker, ACE.  5. Enlarged thoracic aorta - CT 03/24/19 mild aneurysmal dilation of ascending thoracic aorta measuring 4.2 cm, similar since 2013. Plan repeat CT 04/2020. Emphasized BP management.   6. Chronic anticoagulation - Secondary to PAF. CHADs2VASC=4 (HTN, DM2, TIA). Hb 04/2019 16, creatinine 0.94. Long discussion regarding anticoagulation in the setting of PAF to reduce stroke risk. Start Eliquis 5mg  BID. Provided with 30 day free coupon and $10 copay card.   7. PAC - 30 day monitor 04/03/19-05/07/19 with frequent PACs. Discussed risk factor modification including avoidance of pro-arrhthymic medications, smoking, etoh, caffeine. Reports he feels palpitations every once in awhile, reassurance provided. Upcoming repeat echo, previous echo 03/25/19 LA/RA normal size, EF 45-50%, impaired Lv relaxation.  8. HTN - BP well controlled. Encouraged to  purchase arm BP cuff for home monitoring. Recommend contact our office for SBP >130 or <110.  9. HLD - 03/25/19 LDL 43. Continue Lipitor.   10. CVA - Noted history 2015. Per his report received TPA. No residual effects. No etiology identified at that time. Possibly due to underlying PAF now identified on monitor.   Disposition: Follow up in 2 month(s) with Dr. 05/25/19, NP 06/05/2019, 2:49 PM

## 2019-06-05 NOTE — Patient Instructions (Signed)
Medication Instructions:   STOP Clopidogrel (Plavix) 75mg   START Eliquis 5mg  (one tablet) twice daily  *If you need a refill on your cardiac medications before your next appointment, please call your pharmacy*  Lab Work: Your physician recommends that you return for lab work today: BMET, CBC  If you have labs (blood work) drawn today and your tests are completely normal, you will receive your results only by: Marland Kitchen MyChart Message (if you have MyChart) OR . A paper copy in the mail If you have any lab test that is abnormal or we need to change your treatment, we will call you to review the results.  Testing/Procedures: You had an EKG today. It shows sinus rhythm with PACs (these are early beats in the top chamber of your heart)  Follow-Up: At [facility], you and your health needs are our priority.  As part of our continuing mission to provide you with exceptional heart care, we have created designated Provider Care Teams.  These Care Teams include your primary Cardiologist (physician) and Advanced Practice Providers (APPs -  Physician Assistants and Nurse Practitioners) who all work together to provide you with the care you need, when you need it.  Your next appointment:   2 months  The format for your next appointment:   In Person  Provider:   Norman Herrlich, MD  Other Instructions  Your monitor showed atrial fibrillation and PACs (early beats in the top chamber of your heart).  The atrial fibrillation puts you at risk for stroke. We prevent stroke by putting you on an anticoagulation (blood thinner) which is the Eliquis. The PACs (early beats) are not dangerous but are likely the "fluttering" feeling you experience.  Would recommend purchasing an arm blood pressure cuff. Would recommend checking at least once per week. If you notice systolic blood pressures (the top number) is always more than 130 or always less than 110, call our office.    Atrial Fibrillation  Atrial  fibrillation is a type of heartbeat that is irregular or fast (rapid). If you have this condition, your heart beats without any order. This makes it hard for your heart to pump blood in a normal way. Having this condition gives you more risk for stroke, heart failure, and other heart problems. Atrial fibrillation may start all of a sudden and then stop on its own, or it may become a long-lasting problem. What are the causes? This condition may be caused by heart conditions, such as:  High blood pressure.  Heart failure.  Heart valve disease.  Heart surgery. Other causes include:  Pneumonia.  Obstructive sleep apnea.  Lung cancer.  Thyroid disease.  Drinking too much alcohol. Sometimes the cause is not known. What increases the risk? You are more likely to develop this condition if:  You smoke.  You are older.  You have diabetes.  You are overweight.  You have a family history of this condition.  You exercise often and hard. What are the signs or symptoms? Common symptoms of this condition include:  A feeling like your heart is beating very fast.  Chest pain.  Feeling short of breath.  Feeling light-headed or weak.  Getting tired easily. Follow these instructions at home: Medicines  Take over-the-counter and prescription medicines only as told by your doctor.  If your doctor gives you a blood-thinning medicine, take it exactly as told. Taking too much of it can cause bleeding. Taking too little of it does not protect you against clots. Clots can  cause a stroke. Lifestyle      Do not use any tobacco products. These include cigarettes, chewing tobacco, and e-cigarettes. If you need help quitting, ask your doctor.  Do not drink alcohol.  Do not drink beverages that have caffeine. These include coffee, soda, and tea.  Follow diet instructions as told by your doctor.  Exercise regularly as told by your doctor. General instructions  If you have a  condition that causes breathing to stop for a short period of time (apnea), treat it as told by your doctor.  Keep a healthy weight. Do not use diet pills unless your doctor says they are safe for you. Diet pills may make heart problems worse.  Keep all follow-up visits as told by your doctor. This is important. Contact a doctor if:  You notice a change in the speed, rhythm, or strength of your heartbeat.  You are taking a blood-thinning medicine and you see more bruising.  You get tired more easily when you move or exercise.  You have a sudden change in weight. Get help right away if:   You have pain in your chest or your belly (abdomen).  You have trouble breathing.  You have blood in your vomit, poop, or pee (urine).  You have any signs of a stroke. "BE FAST" is an easy way to remember the main warning signs: ? B - Balance. Signs are dizziness, sudden trouble walking, or loss of balance. ? E - Eyes. Signs are trouble seeing or a change in how you see. ? F - Face. Signs are sudden weakness or loss of feeling in the face, or the face or eyelid drooping on one side. ? A - Arms. Signs are weakness or loss of feeling in an arm. This happens suddenly and usually on one side of the body. ? S - Speech. Signs are sudden trouble speaking, slurred speech, or trouble understanding what people say. ? T - Time. Time to call emergency services. Write down what time symptoms started.  You have other signs of a stroke, such as: ? A sudden, very bad headache with no known cause. ? Feeling sick to your stomach (nausea). ? Throwing up (vomiting). ? Jerky movements you cannot control (seizure). These symptoms may be an emergency. Do not wait to see if the symptoms will go away. Get medical help right away. Call your local emergency services (911 in the [place] Do not drive yourself to the hospital. Summary  Atrial fibrillation is a type of heartbeat that is irregular or fast (rapid).  You are  at higher risk of this condition if you smoke, are older, have diabetes, or are overweight.  Follow your doctor's instructions about medicines, diet, exercise, and follow-up visits.  Get help right away if you think that you have signs of a stroke. This information is not intended to replace advice given to you by your health care provider. Make sure you discuss any questions you have with your health care provider. Document Released: 05/10/2008 Document Revised: 10/05/2017 Document Reviewed: 09/22/2017 Elsevier Patient Education  2020 Elsevier Inc.  Premature Atrial Contraction  A premature atrial contraction [facility]) is a kind of irregular heartbeat (arrhythmia). It happens when the heart beats too early and then pauses before beating again. The heart has four areas, or chambers. Normally, electrical signals spread across the heart and make all the chambers beat together. During a PAC, the upper chambers of the heart (atria) beat too early, before they have had time to fill  with blood. The heartbeat pauses afterward so the heart can fill with blood for the next beat. Sometimes PAC can be a warning sign of another type of arrhythmia called atrial fibrillation. Atrial fibrillation may allow blood to pool in the atria and form clots. If a clot travels to the brain, it can cause a stroke. What are the causes? The cause of this condition is often unknown. Sometimes, this condition may be caused by heart disease or injury to the heart. What increases the risk? You are more likely to develop this condition if:  You are a child.  You are an adult who is 56 years of age or older. Episodes may be triggered by:  Caffeine.  Alcohol.  Tobacco use.  Stimulant drugs.  Some medicines or supplements.  Stress.  Heart disease. What are the signs or symptoms? Symptoms of this condition include:  A feeling that your heart skipped a beat. The first heartbeat after the "skipped" beat may feel more  forceful.  A feeling that your heart is fluttering. How is this diagnosed? This condition is diagnosed based on:  Your symptoms.  A physical exam. Your health care provider may listen to your heart.  An electrocardiogram (ECG). This is a test that records the electrical impulses of the heart.  An ambulatory cardiac monitor. This device records your heartbeats for 24 hours or more. You may also have:  An echocardiogram to check for any heart conditions. This is a type of imaging test that uses sound waves (ultrasound) to make images of your heart.  Blood tests. How is this treated? Treatment depends on the frequency of your symptoms and other risk factors. Treatments may include:  Medicines (beta-blockers).  Catheter ablation. This is done to destroy the part of the heart tissue that sends abnormal signals. In some cases, treatment may not be needed for this condition. Follow these instructions at home: Lifestyle  Do not use any products that contain nicotine or tobacco, such as cigarettes, e-cigarettes, and chewing tobacco. If you need help quitting, ask your health care provider.  Exercise regularly. Ask your health care provider what type of exercise is safe for you.  Find healthy ways to manage stress.  Try to get at least 7-9 hours of sleep each night, or as much as recommended by your health care provider. Alcohol use  Do not drink alcohol if: ? Your health care provider tells you not to drink. ? You are pregnant, may be pregnant, or are planning to become pregnant. ? Alcohol triggers your episodes.  If you drink alcohol: ? Limit how much you use to:  0-1 drink a day for women.  0-2 drinks a day for men. ? Be aware of how much alcohol is in your drink. In the [place] one drink equals one 12 oz bottle of beer (355 mL), one 5 oz glass of wine (148 mL), or one 1 oz glass of hard liquor (44 mL). General instructions  Take over-the-counter and prescription medicines  only as told by your health care provider.  If caffeine triggers episodes, do not eat, drink, or use anything with caffeine in it.  Keep all follow-up visits as told by your health care provider. This is important. Contact a health care provider if:  You feel your heart skipping beats.  Your heart skips beats and you feel dizzy, light-headed, or very tired. Get help right away if you have:  Chest pain.  Trouble breathing.  Any symptoms of a stroke. "BE FAST"  is an easy way to remember the main warning signs of a stroke. ? B - Balance. Signs are dizziness, sudden trouble walking, or loss of balance. ? E - Eyes. Signs are trouble seeing or a sudden change in vision. ? F - Face. Signs are sudden weakness or numbness of the face, or the face or eyelid drooping on one side. ? A - Arms. Signs are weakness or numbness in an arm. This happens suddenly and usually on one side of the body. ? S - Speech. Signs are sudden trouble speaking, slurred speech, or trouble understanding what people say. ? T - Time. Time to call emergency services. Write down what time symptoms started.  Other signs of stroke, such as: ? A sudden, severe headache with no known cause. ? Nausea or vomiting. ? Seizure. These symptoms may represent a serious problem that is an emergency. Do not wait to see if the symptoms will go away. Get medical help right away. Call your local emergency services (911 in the [place] Do not drive yourself to the hospital. Summary  A premature atrial contraction [facility]) is a kind of irregular heartbeat (arrhythmia). It happens when the heart beats too early and then pauses before beating again.  Treatment depends on your symptoms and whether you have other underlying heart conditions.  Contact a health care provider if your heart skips beats and you feel dizzy, light-headed, or very tired.  In some cases, this condition may lead to a stroke. "BE FAST" is an easy way to remember the warning  signs of stroke. Get help right away if you have any of the "BE FAST" signs. This information is not intended to replace advice given to you by your health care provider. Make sure you discuss any questions you have with your health care provider. Document Released: 04/04/2014 Document Revised: 04/26/2018 Document Reviewed: 04/26/2018 Elsevier Patient Education  2020 [employer].

## 2019-06-14 ENCOUNTER — Ambulatory Visit: Payer: BC Managed Care – PPO | Admitting: Cardiology

## 2019-06-24 DIAGNOSIS — E118 Type 2 diabetes mellitus with unspecified complications: Secondary | ICD-10-CM | POA: Diagnosis not present

## 2019-06-24 DIAGNOSIS — Z6824 Body mass index (BMI) 24.0-24.9, adult: Secondary | ICD-10-CM | POA: Diagnosis not present

## 2019-06-28 ENCOUNTER — Telehealth: Payer: Self-pay | Admitting: Cardiology

## 2019-06-28 NOTE — Telephone Encounter (Signed)
Patient states that at last office visit he was advised to stop Plavix and start Eliquis 5mg  one tablet twice daily.  Patient has not started Eliquis as he has not picked up from pharmacy.  Patient advised to continue taking Plavix until he picks up Eliquis at pharmacy, and start with Eliquis in the morning once he picks medication up.  Patient agreed to plan and verbalized understanding. No further questions.

## 2019-06-28 NOTE — Telephone Encounter (Signed)
Please call regarding his plavix versus Eliquis

## 2019-07-03 ENCOUNTER — Ambulatory Visit (INDEPENDENT_AMBULATORY_CARE_PROVIDER_SITE_OTHER): Payer: BC Managed Care – PPO

## 2019-07-03 ENCOUNTER — Other Ambulatory Visit: Payer: Self-pay

## 2019-07-03 DIAGNOSIS — I519 Heart disease, unspecified: Secondary | ICD-10-CM | POA: Diagnosis not present

## 2019-07-03 NOTE — Progress Notes (Signed)
Complete echocardiogram has been performed.  Jimmy Joss Friedel RDCS, RVT 

## 2019-07-17 ENCOUNTER — Encounter: Payer: Self-pay | Admitting: Cardiology

## 2019-07-17 ENCOUNTER — Other Ambulatory Visit: Payer: Self-pay

## 2019-07-17 ENCOUNTER — Ambulatory Visit (INDEPENDENT_AMBULATORY_CARE_PROVIDER_SITE_OTHER): Payer: BC Managed Care – PPO | Admitting: Cardiology

## 2019-07-17 VITALS — BP 112/68 | HR 86 | Ht 72.0 in | Wt 185.0 lb

## 2019-07-17 DIAGNOSIS — E11649 Type 2 diabetes mellitus with hypoglycemia without coma: Secondary | ICD-10-CM

## 2019-07-17 DIAGNOSIS — Z7901 Long term (current) use of anticoagulants: Secondary | ICD-10-CM

## 2019-07-17 DIAGNOSIS — I480 Paroxysmal atrial fibrillation: Secondary | ICD-10-CM

## 2019-07-17 DIAGNOSIS — I10 Essential (primary) hypertension: Secondary | ICD-10-CM

## 2019-07-17 DIAGNOSIS — R55 Syncope and collapse: Secondary | ICD-10-CM

## 2019-07-17 NOTE — Progress Notes (Signed)
Cardiology Office Note:    Date:  07/17/2019   ID:  Patrick Curry, DOB Dec 15, 1960, MRN [id number]  PCP:  Ocie Doyne., MD  Cardiologist:  Shirlee More, MD    Referring MD: Ocie Doyne., MD    ASSESSMENT:    1. PAF (paroxysmal atrial fibrillation) ([place])   2. Chronic anticoagulation   3. Syncope and collapse   4. Essential hypertension   5. Hypoglycemia associated with diabetes ([place])    PLAN:    In order of problems listed above:  1. He is in a very high risk group for recurrent stroke and with atrial fibrillation is now anticoagulated on Eliquis.  He will continue the same has had no bleeding complication 2. In retrospect his loss of consciousness almost certainly was symptomatic hypoglycemia improved off the oral agent and no recurrence on his monitor he had no evidence of bradycardia. 3. Stable hypertension continue medical treatment including ACE inhibitor beta-blocker with his CAD and cardiomyopathy 4. Hypoglycemia resolved 5. Stable dyslipidemia continue with statin   Next appointment: 6 months   Medication Adjustments/Labs and Tests Ordered: Current medicines are reviewed at length with the patient today.  Concerns regarding medicines are outlined above.  No orders of the defined types were placed in this encounter.  No orders of the defined types were placed in this encounter.   Chief Complaint  Patient presents with  . Follow-up  . Loss of Consciousness  . Cardiomyopathy  . Coronary Artery Disease  . Atrial Fibrillation  . Anticoagulation    History of Present Illness:    Patrick Curry is a 58 y.o. male with a hx of CAD, HTN, HLD, DVT, syncope, DM2, abdominal aortic aneurysm, and CVA . He was admitted 03/24/19 for chest pain and syncope. Ct angiogram without PE but noted more calcifications. Echo EF 45% consistent with NICM. Noted to be hypoglycemic. He underwent LHC which showed mild non obstructive CAD  He was last seen by me 04/03/2019 and by CW 06/05/2019  and started on Apixaban. Compliance with diet, lifestyle and medications: Yes  30 day monitor 04/2019 The predominant rhythm was sinus with minimum average and maximum heart rates of 50, 75 and 153 bpm. Atrial fibrillation occurred single episode less than 1% total 11 minutes with average heart rate of 104 bpm.  There was one critical event of syncope associated with sinus rhythm and frequent APCs there was no pauses or episode of sinus node or AV block.  There were 47 stable events 16 of them associated with APCs one with PVCs. There was a notation of accelerated junctional idioventricular rhythm which represented ectopic atrial rhythm.  There were no episodes of ventricular tachycardia or atrial flutter.  Conclusion, paroxysmal atrial fibrillation with a low burden less than 1% as well as frequent atrial premature beats. A syncopal episode occurred not associated with an arrhythmia such as sinus node or AV nodal block.  Echo 03/25/19 1. The left ventricle has mildly reduced systolic function, with an ejection fraction of 45-50%. The cavity size was normal. Left ventricular diastolic Doppler parameters are consistent with impaired relaxation. There appears to be inferior and  inferolateral hypokinesis. 2. The right ventricle has normal systolic function. The cavity was normal. There is no increase in right ventricular wall thickness. 3. No evidence of mitral valve stenosis. Trivial mitral regurgitation. 4. The aortic valve is tricuspid. No stenosis of the aortic valve. 5. The aorta is normal in size and structure. 6. The IVC was normal in size.  No complete TR doppler jet so unable to estimate PA systolic pressure.  LHC 03/25/19 Conclusions: 1. 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 2. Normal left ventricular filling pressure.  He tolerates his anticoagulant he has had no bleeding complication.  His blood sugars now run in the range  of 150 mg percent and he has had no recurrent symptomatic hypoglycemia or syncope.  He does very visit vigorous active physical labor and has had no angina dyspnea palpitation or exercise intolerance. Past Medical History:  Diagnosis Date  . Asthma 06/20/2017  . Carotid stenosis 10/08/2015  . Diarrhea 10/08/2015  . DVT (deep venous thrombosis) (HCC)   . Hemorrhagic colitis 02/12/2015  . Hypercholesterolemia 06/20/2017  . Hypertension associated with type 2 diabetes mellitus (H[facility])   . IBD (inflammatory bowel disease) 10/08/2015  . Ketoacidosis 02/12/2015  . Kidney stones 06/20/2017  . Migraine 03/25/2014  . Nonischemic cardiomyopathy (HCC) 03/26/2019   per cath 03/25/19  . PAC (premature atrial contraction)    (a) 30 day monitor 05/07/19 with frequent PACs  . PAF (paroxysmal atrial fibrillation) (H[facility]) 05/07/2019   (a) 30 day monitor 05/07/19 with 11 minute episode of PAF with average rate 104 bpm  . Palpitations   . Severe sepsis (HCC) 02/12/2015  . Stroke (HCC) 03/25/2014  . Type 2 diabetes mellitus without complication (HCC) 02/12/2015    Past Surgical History:  Procedure Laterality Date  . CHOLECYSTECTOMY    . COLON RESECTION     Partial, after trauma  . IVC FILTER INSERTION  2011  . LEFT HEART CATH AND CORONARY ANGIOGRAPHY N/A 03/25/2019   Procedure: LEFT HEART CATH AND CORONARY ANGIOGRAPHY;  Surgeon: Yvonne Kendall, MD;  Location: [facility] INVASIVE CV LAB;  Service: Cardiovascular;  Laterality: N/A;    Current Medications: Current Meds  Medication Sig  . atorvastatin (LIPITOR) 40 MG tablet Take 40 mg by mouth daily.  . carvedilol (COREG) 6.25 MG tablet TAKE ONE TABLET BY MOUTH TWICE DAILY WITH FOOD  . clopidogrel (PLAVIX) 75 MG tablet Take 75 mg by mouth daily.  . cyanocobalamin 2000 MCG tablet Take 2,000 mcg by mouth daily.   . folic acid (FOLVITE) 1 MG tablet Take 1 mg by mouth daily.   Marland Kitchen gabapentin (NEURONTIN) 800 MG tablet Take 800 mg by mouth daily as needed.  . Lactobacillus ([employer]  PROBIOTIC ACIDOPHILUS) 10 MG CAPS Take 1 capsule by mouth daily.   Marland Kitchen LANTUS SOLOSTAR 100 UNIT/ML Solostar Pen Inject 52 Units into the skin daily. 30 min before breakfast  . lisinopril (ZESTRIL) 5 MG tablet Take 1 tablet (5 mg total) by mouth daily.  . metFORMIN (GLUCOPHAGE) 1000 MG tablet Take 1,000 mg by mouth 2 (two) times daily with a meal.   . montelukast (SINGULAIR) 10 MG tablet Take 10 mg by mouth daily.   . nitroGLYCERIN (NITROSTAT) 0.4 MG SL tablet Place 0.4 mg under the tongue every 5 (five) minutes as needed for chest pain.   . sildenafil (REVATIO) 20 MG tablet Take 40-100 mg by mouth as needed.   . sulfaSALAzine (AZULFIDINE) 500 MG tablet Take 1,500 mg by mouth 2 (two) times daily.   Ronette Deter COMFORT PEN NEEDLES 31G X 8 MM MISC See admin instructions.  . traMADol (ULTRAM) 50 MG tablet Take 50 mg by mouth daily as needed.  . Vitamin D, Ergocalciferol, (DRISDOL) 50000 units CAPS capsule Take 50,000 Units by mouth every 7 (seven) days. Mondays     Allergies:   Patient has no  known allergies.   Social History   Socioeconomic History  . Marital status: Married    Spouse name: Not on file  . Number of children: Not on file  . Years of education: Not on file  . Highest education level: Not on file  Occupational History  . Occupation: Set designer, some Conservation officer, historic buildings: [employer]  Social Needs  . Financial resource strain: Not on file  . Food insecurity    Worry: Not on file    Inability: Not on file  . Transportation needs    Medical: Not on file    Non-medical: Not on file  Tobacco Use  . Smoking status: Never Smoker  . Smokeless tobacco: Never Used  Substance and Sexual Activity  . Alcohol use: No    Frequency: Never  . Drug use: No  . Sexual activity: Not on file  Lifestyle  . Physical activity    Days per week: Not on file    Minutes per session: Not on file  . Stress: Not on file  Relationships  . Social Musician on phone:  Not on file    Gets together: Not on file    Attends religious service: Not on file    Active member of club or organization: Not on file    Attends meetings of clubs or organizations: Not on file    Relationship status: Not on file  Other Topics Concern  . Not on file  Social History Narrative   Lives in [place] with wife and disabled son.     Family History: The patient's family history includes Diabetes in his mother; Diabetes (age of onset: 40) in his father; Heart disease (age of onset: 48) in his mother; Hypertension in his mother. ROS:   Please see the history of present illness.    All other systems reviewed and are negative.  EKGs/Labs/Other Studies Reviewed:    The following studies were reviewed today:    Recent Labs: 03/25/2019: TSH 1.150 03/26/2019: BUN 18; Creatinine, Ser 0.95; Hemoglobin 14.9; Platelets 251; Potassium 3.8; Sodium 136  Recent Lipid Panel    Component Value Date/Time   CHOL 110 03/25/2019 0558   TRIG 96 03/25/2019 0558   HDL 48 03/25/2019 0558   CHOLHDL 2.3 03/25/2019 0558   VLDL 19 03/25/2019 0558   LDLCALC 43 03/25/2019 0558    Physical Exam:    VS:  BP 112/68 (BP Location: Right Arm, Patient Position: Sitting, Cuff Size: Normal)   Pulse 86   Ht 6' (1.829 m)   Wt 185 lb (83.9 kg)   SpO2 98%   BMI 25.09 kg/m     Wt Readings from Last 3 Encounters:  07/17/19 185 lb (83.9 kg)  06/05/19 182 lb (82.6 kg)  04/03/19 180 lb 9.6 oz (81.9 kg)     GEN:  Well nourished, well developed in no acute distress HEENT: Normal NECK: No JVD; No carotid bruits LYMPHATICS: No lymphadenopathy CARDIAC: RRR, no murmurs, rubs, gallops RESPIRATORY:  Clear to auscultation without rales, wheezing or rhonchi  ABDOMEN: Soft, non-tender, non-distended MUSCULOSKELETAL:  No edema; No deformity  SKIN: Warm and dry NEUROLOGIC:  Alert and oriented x 3 PSYCHIATRIC:  Normal affect    Signed, Norman Herrlich, MD  07/17/2019 10:40 AM    [facility]

## 2019-07-17 NOTE — Patient Instructions (Signed)
Medication Instructions:  Your physician recommends that you continue on your current medications as directed. Please refer to the Current Medication list given to you today.  *If you need a refill on your cardiac medications before your next appointment, please call your pharmacy*  Lab Work: NONE If you have labs (blood work) drawn today and your tests are completely normal, you will receive your results only by: . MyChart Message (if you have MyChart) OR . A paper copy in the mail If you have any lab test that is abnormal or we need to change your treatment, we will call you to review the results.  Testing/Procedures: NONE  Follow-Up: At [facility], you and your health needs are our priority.  As part of our continuing mission to provide you with exceptional heart care, we have created designated Provider Care Teams.  These Care Teams include your primary Cardiologist (physician) and Advanced Practice Providers (APPs -  Physician Assistants and Nurse Practitioners) who all work together to provide you with the care you need, when you need it.  Your next appointment:   6 month(s)  The format for your next appointment:   In Person  Provider:   Brian Munley, MD    

## 2019-07-30 DIAGNOSIS — Z23 Encounter for immunization: Secondary | ICD-10-CM | POA: Diagnosis not present

## 2019-07-30 DIAGNOSIS — Z6824 Body mass index (BMI) 24.0-24.9, adult: Secondary | ICD-10-CM | POA: Diagnosis not present

## 2019-07-30 DIAGNOSIS — E785 Hyperlipidemia, unspecified: Secondary | ICD-10-CM | POA: Diagnosis not present

## 2019-07-30 DIAGNOSIS — J301 Allergic rhinitis due to pollen: Secondary | ICD-10-CM | POA: Diagnosis not present

## 2019-07-30 DIAGNOSIS — Z0000 Encounter for general adult medical examination without abnormal findings: Secondary | ICD-10-CM | POA: Diagnosis not present

## 2019-08-29 DIAGNOSIS — Z8673 Personal history of transient ischemic attack (TIA), and cerebral infarction without residual deficits: Secondary | ICD-10-CM | POA: Diagnosis not present

## 2019-08-29 DIAGNOSIS — R0602 Shortness of breath: Secondary | ICD-10-CM | POA: Diagnosis not present

## 2019-08-29 DIAGNOSIS — I252 Old myocardial infarction: Secondary | ICD-10-CM | POA: Diagnosis not present

## 2019-08-29 DIAGNOSIS — E119 Type 2 diabetes mellitus without complications: Secondary | ICD-10-CM | POA: Diagnosis not present

## 2019-08-29 DIAGNOSIS — R079 Chest pain, unspecified: Secondary | ICD-10-CM | POA: Diagnosis not present

## 2019-08-29 DIAGNOSIS — I4891 Unspecified atrial fibrillation: Secondary | ICD-10-CM | POA: Diagnosis not present

## 2019-09-05 DIAGNOSIS — I4891 Unspecified atrial fibrillation: Secondary | ICD-10-CM | POA: Diagnosis not present

## 2019-09-05 DIAGNOSIS — G8929 Other chronic pain: Secondary | ICD-10-CM | POA: Diagnosis not present

## 2019-09-05 DIAGNOSIS — E118 Type 2 diabetes mellitus with unspecified complications: Secondary | ICD-10-CM | POA: Diagnosis not present

## 2019-09-05 DIAGNOSIS — E538 Deficiency of other specified B group vitamins: Secondary | ICD-10-CM | POA: Diagnosis not present

## 2019-10-28 DIAGNOSIS — E785 Hyperlipidemia, unspecified: Secondary | ICD-10-CM | POA: Diagnosis not present

## 2019-10-28 DIAGNOSIS — E118 Type 2 diabetes mellitus with unspecified complications: Secondary | ICD-10-CM | POA: Diagnosis not present

## 2019-10-28 DIAGNOSIS — Z125 Encounter for screening for malignant neoplasm of prostate: Secondary | ICD-10-CM | POA: Diagnosis not present

## 2019-10-28 DIAGNOSIS — Z79899 Other long term (current) drug therapy: Secondary | ICD-10-CM | POA: Diagnosis not present

## 2021-11-10 ENCOUNTER — Telehealth: Payer: Self-pay

## 2021-11-10 NOTE — Telephone Encounter (Signed)
? ?  [facility] Pre-operative Risk Assessment  ?  ?Request for surgical clearance: ? ?What type of surgery is being performed? ESW Lithotripsy ? ?When is this surgery scheduled? 11/12/2021  ? ?What type of clearance is required (medical clearance vs. Pharmacy clearance to hold med vs. Both)? Both ? ?Are there any medications that need to be held prior to surgery and how long?Not specified however the patient is on Plavix and Eliquis   ? ?Practice name and name of physician performing surgery? [facility] by Dr. Earney Navy.   ? ?What is your office phone number: [phone]  ?  ?7.   What is your office fax number: [phone] ? ?8.   Anesthesia type (None, local, MAC, general) ? Moderate Anesthesia  ? ? ?Dodger Sinning M Sreekar Broyhill ?11/10/2021, 4:22 PM  ?_________________________________________________________________ ?  (provider comments below) ? ?

## 2021-11-10 NOTE — Telephone Encounter (Signed)
? ?  Name: Patrick Curry  ?DOB: 1960/12/04  ?MRN: [id number] ? ?Primary Cardiologist: Shirlee More, MD ? ?Chart reviewed as part of pre-operative protocol coverage. Because of [name] past medical history and time since last visit, he will require a follow-up visit in order to better assess preoperative cardiovascular risk. ? ?Pre-op covering staff: ?- Please schedule appointment and call patient to inform them. If patient already had an upcoming appointment within acceptable timeframe, please add "pre-op clearance" to the appointment notes so provider is aware. ?- Please contact requesting surgeon's office via preferred method (i.e, phone, fax) to inform them of need for appointment prior to surgery. ? ?If applicable, this message will also be routed to pharmacy pool and/or primary cardiologist for input on holding anticoagulant/antiplatelet agent as requested below so that this information is available to the clearing provider at time of patient's appointment.  ? ? ?Please inform the surgeon's office that the patient will need a follow-up before we can clear him since we have not seen him in the office since 07/17/2019.  If the surgery is on 11/12/2021, it is unlikely we can clear the patient prior to the surgery.  We will defer to the surgeon's office to decide if they wish to delay the surgery while waiting for cardiac clearance or proceed without cardiac clearance. ? ?Almyra Deforest, [place]  ?11/10/2021, 6:10 PM  ? ?

## 2021-11-11 ENCOUNTER — Telehealth: Payer: Self-pay

## 2021-11-11 NOTE — Telephone Encounter (Signed)
Dr. Ara Kussmaul office is aware the patient is schedule in June and that he has not been seen since 2020. ?

## 2021-11-11 NOTE — Telephone Encounter (Signed)
I will fax notes to requesting office to see notes from pre op provider Almyra Deforest, [place]. Pt will need an appt, last seen was 2020. Pt has appt 02/01/22 with Dr. Bettina Gavia.  ?

## 2021-12-01 NOTE — Telephone Encounter (Signed)
Tresa Endo from [facility] called and mentioned that patient is systematic with kidney stones. Wants to know if patient can get sooner appt ?

## 2021-12-01 NOTE — Telephone Encounter (Signed)
Appointment with Dr. Agustin Cree on 4/20 ?

## 2021-12-02 ENCOUNTER — Ambulatory Visit (INDEPENDENT_AMBULATORY_CARE_PROVIDER_SITE_OTHER): Payer: 59 | Admitting: Cardiology

## 2021-12-02 ENCOUNTER — Encounter: Payer: Self-pay | Admitting: Cardiology

## 2021-12-02 VITALS — BP 110/72 | HR 105 | Ht 71.0 in | Wt 187.0 lb

## 2021-12-02 DIAGNOSIS — I480 Paroxysmal atrial fibrillation: Secondary | ICD-10-CM | POA: Diagnosis not present

## 2021-12-02 DIAGNOSIS — I10 Essential (primary) hypertension: Secondary | ICD-10-CM

## 2021-12-02 DIAGNOSIS — Z01810 Encounter for preprocedural cardiovascular examination: Secondary | ICD-10-CM

## 2021-12-02 DIAGNOSIS — Z8673 Personal history of transient ischemic attack (TIA), and cerebral infarction without residual deficits: Secondary | ICD-10-CM

## 2021-12-02 DIAGNOSIS — G459 Transient cerebral ischemic attack, unspecified: Secondary | ICD-10-CM | POA: Insufficient documentation

## 2021-12-02 DIAGNOSIS — I714 Abdominal aortic aneurysm, without rupture, unspecified: Secondary | ICD-10-CM

## 2021-12-02 NOTE — Progress Notes (Signed)
?Cardiology Office Note:   ? ?Date:  12/02/2021  ? ?ID:  Patrick Curry, DOB 29-Mar-1961, MRN [id number] ? ?PCP:  Jim Like, NP  ?Cardiologist:  Gypsy Balsam, MD   ? ?Referring MD: Guadalupe Maple., MD  ? ?Chief Complaint  ?Patient presents with  ? clearanceTBD  ? ? ?History of Present Illness:   ? ?Patrick Curry is a 61 y.o. male with past medical history significant for coronary artery disease, in August 2020 he had cardiac catheterization done which showed mild nonobstructive coronary artery disease involving ostial LAD and mid RCA, paroxysmal atrial fibrillation, status post CVA moderate 3 years ago he did receive tPA with almost complete recovery, diabetes mellitus, hypertension, DVT.  He does have kidney stones and he required lithotripsy, he was referred to [place] for evaluation before that surgery.  Overall he said he is doing great.  He can walk climb stairs with no difficulties.  Denies have any chest pain tightness squeezing pressure burning chest.  There is no palpitations no dizziness ? ?Past Medical History:  ?Diagnosis Date  ? Asthma 06/20/2017  ? Carotid stenosis 10/08/2015  ? Diarrhea 10/08/2015  ? DVT (deep venous thrombosis) (HCC)   ? Hemorrhagic colitis 02/12/2015  ? Hypercholesterolemia 06/20/2017  ? Hypertension associated with type 2 diabetes mellitus (HCC)   ? IBD (inflammatory bowel disease) 10/08/2015  ? Ketoacidosis 02/12/2015  ? Kidney stones 06/20/2017  ? Migraine 03/25/2014  ? Nonischemic cardiomyopathy (HCC) 03/26/2019  ? per cath 03/25/19  ? PAC (premature atrial contraction)   ? (a) 30 day monitor 05/07/19 with frequent PACs  ? PAF (paroxysmal atrial fibrillation) (HCC) 05/07/2019  ? (a) 30 day monitor 05/07/19 with 11 minute episode of PAF with average rate 104 bpm  ? Palpitations   ? Severe sepsis (HCC) 02/12/2015  ? Stroke (HCC) 03/25/2014  ? Type 2 diabetes mellitus without complication (HCC) 02/12/2015  ? ? ?Past Surgical History:  ?Procedure Laterality Date  ? CHOLECYSTECTOMY    ? COLON RESECTION     ? Partial, after trauma  ? IVC FILTER INSERTION  2011  ? LEFT HEART CATH AND CORONARY ANGIOGRAPHY N/A 03/25/2019  ? Procedure: LEFT HEART CATH AND CORONARY ANGIOGRAPHY;  Surgeon: Yvonne Kendall, MD;  [facility]: [facility];  Service: Cardiovascular;  Laterality: N/A;  ? ? ?Current Medications: ?Current Meds  ?Medication Sig  ? apixaban (ELIQUIS) 5 MG TABS tablet Take 1 tablet (5 mg total) by mouth 2 (two) times daily.  ? atorvastatin (LIPITOR) 40 MG tablet Take 40 mg by mouth daily.  ? benzonatate (TESSALON) 200 MG capsule Take 200 mg by mouth 2 (two) times daily.  ? carvedilol (COREG) 6.25 MG tablet TAKE ONE TABLET BY MOUTH TWICE DAILY WITH FOOD  ? clopidogrel (PLAVIX) 75 MG tablet Take 75 mg by mouth daily.  ? cyanocobalamin 2000 MCG tablet Take 2,000 mcg by mouth daily.   ? folic acid (FOLVITE) 1 MG tablet Take 1 mg by mouth daily.   ? gabapentin (NEURONTIN) 800 MG tablet Take 800 mg by mouth daily as needed.  ? Lactobacillus (CVS PROBIOTIC ACIDOPHILUS) 10 MG CAPS Take 1 capsule by mouth daily.   ? LANTUS SOLOSTAR 100 UNIT/ML Solostar Pen Inject 80 Units into the skin daily. 30 min before breakfast  ? metFORMIN (GLUCOPHAGE) 1000 MG tablet Take 1,000 mg by mouth 2 (two) times daily with a meal.   ? montelukast (SINGULAIR) 10 MG tablet Take 10 mg by mouth daily.   ? nitroGLYCERIN (NITROSTAT) 0.4 MG SL  tablet Place 0.4 mg under the tongue every 5 (five) minutes as needed for chest pain.   ? predniSONE (DELTASONE) 10 MG tablet Take 10 mg by mouth 2 (two) times daily.  ? sildenafil (REVATIO) 20 MG tablet Take 40-100 mg by mouth as needed.   ? sulfaSALAzine (AZULFIDINE) 500 MG tablet Take 1,500 mg by mouth 2 (two) times daily.   ? SYNJARDY XR 25-1000 MG TB24 Take 1 tablet by mouth daily.  ? Vitamin D, Ergocalciferol, (DRISDOL) 50000 units CAPS capsule Take 50,000 Units by mouth every 7 (seven) days. Mondays  ? [DISCONTINUED] SURE COMFORT PEN NEEDLES 31G X 8 MM MISC See admin instructions.  ? [DISCONTINUED]  traMADol (ULTRAM) 50 MG tablet Take 50 mg by mouth daily as needed for moderate pain.  ?  ? ?Allergies:   Patient has no known allergies.  ? ?Social History  ? ?Socioeconomic History  ? Marital status: Married  ?  Spouse name: Not on file  ? Number of children: Not on file  ? Years of education: Not on file  ? Highest education level: Not on file  ?Occupational History  ? Occupation: Set designer, some lifting  ?  Employer: [employer]  ?Tobacco Use  ? Smoking status: Never  ? Smokeless tobacco: Never  ?Vaping Use  ? Vaping Use: Never used  ?Substance and Sexual Activity  ? Alcohol use: No  ? Drug use: No  ? Sexual activity: Not on file  ?Other Topics Concern  ? Not on file  ?Social History Narrative  ? Lives in [place] with wife and disabled son.  ? ?Social Determinants of Health  ? ?Financial Resource Strain: Not on file  ?Food Insecurity: Not on file  ?Transportation Needs: Not on file  ?Physical Activity: Not on file  ?Stress: Not on file  ?Social Connections: Not on file  ?  ? ?Family History: ?The patient's family history includes Diabetes in his mother; Diabetes (age of onset: 12) in his father; Heart disease (age of onset: 68) in his mother; Hypertension in his mother. ?ROS:   ?Please see the history of present illness.    ?All 14 point review of systems negative except as described per history of present illness ? ?EKGs/Labs/Other Studies Reviewed:   ? ? ? ?Recent Labs: ?No results found for requested labs within last 8760 hours.  ?Recent Lipid Panel ?   ?Component Value Date/Time  ? CHOL 110 03/25/2019 0558  ? TRIG 96 03/25/2019 0558  ? HDL 48 03/25/2019 0558  ? CHOLHDL 2.3 03/25/2019 0558  ? VLDL 19 03/25/2019 0558  ? LDLCALC 43 03/25/2019 0558  ? ? ?Physical Exam:   ? ?VS:  BP 110/72 (BP Location: Left Arm, Patient Position: Sitting)   Pulse (!) 105   Ht 5\' 11"  (1.803 m)   Wt 187 lb (84.8 kg)   SpO2 94%   BMI 26.08 kg/m?    ? ?Wt Readings from Last 3 Encounters:  ?12/02/21 187 lb  (84.8 kg)  ?07/17/19 185 lb (83.9 kg)  ?06/05/19 182 lb (82.6 kg)  ?  ? ?GEN:  Well nourished, well developed in no acute distress ?HEENT: Normal ?NECK: No JVD; No carotid bruits ?LYMPHATICS: No lymphadenopathy ?CARDIAC: RRR, no murmurs, no rubs, no gallops ?RESPIRATORY:  Clear to auscultation without rales, wheezing or rhonchi  ?ABDOMEN: Soft, non-tender, non-distended ?MUSCULOSKELETAL:  No edema; No deformity  ?SKIN: Warm and dry ?LOWER EXTREMITIES: no swelling ?NEUROLOGIC:  Alert and oriented x 3 ?PSYCHIATRIC:  Normal affect  ? ?ASSESSMENT:   ? ?  1. PAF (paroxysmal atrial fibrillation) (HCC)   ?2. Preop cardiovascular exam   ?3. Essential hypertension   ?4. History of CVA (cerebrovascular accident)   ?5. Abdominal aortic aneurysm (AAA) without rupture, unspecified part (HCC)   ? ?PLAN:   ? ?In order of problems listed above: ? ?Paroxysmal atrial fibrillation he is maintained sinus rhythm, denies having a palpitation the question is about holding his anticoagulation for lithotripsy and I think it will be reasonable to do even though we do understand that it is a relatively high risk because of his risk factors and high CHADS2 Vascor which is 4.  Ideally will be if we can hold anticoagulation for only 24 hours but if 48 hours as needed will be acceptable ?History of cardiomyopathy ejection fraction 45% consistent with known ischemic cardiomyopathy.  I will schedule him to have echocardiogram done to assess left ventricle ejection fraction in the meantime we will continue him on the medications.  We will repeat echocardiogram ?Essential hypertension: Blood pressure well controlled continue present management. ?History of CVA.  Status post tPA.  Most likely related to atrial fibrillation but it was more than 3 years ago ?Cardiovascular evaluation before lithotripsy.  I think it would be very reasonable to hold Plavix as well as Eliquis in terms of Eliquis ideally only 24 hours if possible if not 48 hours, Plavix can  be held for 5 days ? ? ?Medication Adjustments/Labs and Tests Ordered: ?Current medicines are reviewed at length with the patient today.  Concerns regarding medicines are outlined above.  ?No orders of the de

## 2021-12-02 NOTE — Patient Instructions (Signed)
Medication Instructions:  ?Your physician recommends that you continue on your current medications as directed. Please refer to the Current Medication list given to you today.  ?*If you need a refill on your cardiac medications before your next appointment, please call your pharmacy* ? ? ?Lab Work: ?None Ordered ?If you have labs (blood work) drawn today and your tests are completely normal, you will receive your results only by: ?MyChart Message (if you have MyChart) OR ?A paper copy in the mail ?If you have any lab test that is abnormal or we need to change your treatment, we will call you to review the results. ? ? ?Testing/Procedures: ?Your physician has requested that you have an echocardiogram. Echocardiography is a painless test that uses sound waves to create images of your heart. It provides your doctor with information about the size and shape of your heart and how well your heart?s chambers and valves are working. This procedure takes approximately one hour. There are no restrictions for this procedure.  ? ?Your physician has requested that you have a carotid duplex. This test is an ultrasound of the carotid arteries in your neck. It looks at blood flow through these arteries that supply the brain with blood. Allow one hour for this exam. There are no restrictions or special instructions.  ? ? ?Follow-Up: ?At [facility], you and your health needs are our priority.  As part of our continuing mission to provide you with exceptional heart care, we have created designated Provider Care Teams.  These Care Teams include your primary Cardiologist (physician) and Advanced Practice Providers (APPs -  Physician Assistants and Nurse Practitioners) who all work together to provide you with the care you need, when you need it. ? ?We recommend signing up for the patient portal called "MyChart".  Sign up information is provided on this After Visit Summary.  MyChart is used to connect with patients for Virtual Visits  (Telemedicine).  Patients are able to view lab/test results, encounter notes, upcoming appointments, etc.  Non-urgent messages can be sent to your provider as well.   ?To learn more about what you can do with MyChart, go to [email or web address].   ? ?Your next appointment:   ?6 month(s) ? ?The format for your next appointment:   ?In Person ? ?Provider:   ?Robert Krasowski, MD  ? ? ?Other Instructions ?NA  ?

## 2021-12-20 ENCOUNTER — Ambulatory Visit (INDEPENDENT_AMBULATORY_CARE_PROVIDER_SITE_OTHER): Payer: 59

## 2021-12-20 DIAGNOSIS — I10 Essential (primary) hypertension: Secondary | ICD-10-CM

## 2021-12-20 DIAGNOSIS — Z01810 Encounter for preprocedural cardiovascular examination: Secondary | ICD-10-CM | POA: Diagnosis not present

## 2021-12-20 DIAGNOSIS — Z8673 Personal history of transient ischemic attack (TIA), and cerebral infarction without residual deficits: Secondary | ICD-10-CM

## 2021-12-20 DIAGNOSIS — I480 Paroxysmal atrial fibrillation: Secondary | ICD-10-CM

## 2021-12-20 LAB — ECHOCARDIOGRAM COMPLETE
Area-P 1/2: 2.45 cm2
Calc EF: 50.2 %
S' Lateral: 3.6 cm
Single Plane A2C EF: 44.1 %
Single Plane A4C EF: 55.4 %

## 2022-01-29 NOTE — Progress Notes (Deleted)
Cardiology Office Note:    Date:  01/29/2022   ID:  Patrick Curry, DOB 1960-12-07, MRN [id number]  PCP:  Jim Like, NP  Cardiologist:  Norman Herrlich, MD    Referring MD: Guadalupe Maple., MD    ASSESSMENT:    No diagnosis found. PLAN:    In order of problems listed above:  ***   Next appointment: ***   Medication Adjustments/Labs and Tests Ordered: Current medicines are reviewed at length with the patient today.  Concerns regarding medicines are outlined above.  No orders of the defined types were placed in this encounter.  No orders of the defined types were placed in this encounter.   No chief complaint on file.   History of Present Illness:    Patrick Curry is a 61 y.o. male with a hx of CAD stroke hypertension hyperlipidemia type 2 diabetes syncope last seen by me 07/17/2019.  Previous echocardiogram showed mildly reduced ejection fraction 45% previous coronary angiography showed mild nonobstructive CAD.  With paroxysmal atrial fibrillation he was anticoagulated.. Compliance with diet, lifestyle and medications: ***  Following a visit with one of my partners 12/02/2021 he underwent testing including Echocardiogram: Reported 12/20/2021 Left ventricular ejection fraction low normal 50 to 55% normal right ventricular size function and pulmonary artery pressure both atria are mildly dilated and no valvular abnormalities noted. Cerebrovascular duplex was performed 12/21/2021 with mild 40 to 59% bilateral ICA stenosis. Past Medical History:  Diagnosis Date   Asthma 06/20/2017   Carotid stenosis 10/08/2015   Diarrhea 10/08/2015   DVT (deep venous thrombosis) (HCC)    Hemorrhagic colitis 02/12/2015   Hypercholesterolemia 06/20/2017   Hypertension associated with type 2 diabetes mellitus (HCC)    IBD (inflammatory bowel disease) 10/08/2015   Ketoacidosis 02/12/2015   Kidney stones 06/20/2017   Migraine 03/25/2014   Nonischemic cardiomyopathy (HCC) 03/26/2019   per cath 03/25/19    PAC (premature atrial contraction)    (a) 30 day monitor 05/07/19 with frequent PACs   PAF (paroxysmal atrial fibrillation) (HCC) 05/07/2019   (a) 30 day monitor 05/07/19 with 11 minute episode of PAF with average rate 104 bpm   Palpitations    Severe sepsis (HCC) 02/12/2015   Stroke (HCC) 03/25/2014   Type 2 diabetes mellitus without complication (HCC) 02/12/2015    Past Surgical History:  Procedure Laterality Date   CHOLECYSTECTOMY     COLON RESECTION     Partial, after trauma   IVC FILTER INSERTION  2011   LEFT HEART CATH AND CORONARY ANGIOGRAPHY N/A 03/25/2019   Procedure: LEFT HEART CATH AND CORONARY ANGIOGRAPHY;  Surgeon: Yvonne Kendall, MD;  Location: [facility];  Service: Cardiovascular;  Laterality: N/A;    Current Medications: No outpatient medications have been marked as taking for the 02/01/22 encounter (Appointment) with Baldo Daub, MD.     Allergies:   Patient has no known allergies.   Social History   Socioeconomic History   Marital status: Married    Spouse name: Not on file   Number of children: Not on file   Years of education: Not on file   Highest education level: Not on file  Occupational History   Occupation: Set designer, some lifting    Employer: [employer]  Tobacco Use   Smoking status: Never   Smokeless tobacco: Never  Vaping Use   Vaping Use: Never used  Substance and Sexual Activity   Alcohol use: No   Drug use: No   Sexual activity: Not on  file  Other Topics Concern   Not on file  Social History Narrative   Lives in [place] with wife and disabled son.   Social Determinants of Health   Financial Resource Strain: Not on file  Food Insecurity: Not on file  Transportation Needs: Not on file  Physical Activity: Not on file  Stress: Not on file  Social Connections: Not on file     Family History: The patient's ***family history includes Diabetes in his mother; Diabetes (age of onset: 17) in his father;  Heart disease (age of onset: 50) in his mother; Hypertension in his mother. ROS:   Please see the history of present illness.    All other systems reviewed and are negative.  EKGs/Labs/Other Studies Reviewed:    The following studies were reviewed today:  EKG:  EKG ordered today and personally reviewed.  The ekg ordered today demonstrates ***  Recent Labs: No results found for requested labs within last 365 days.  Recent Lipid Panel    Component Value Date/Time   CHOL 110 03/25/2019 0558   TRIG 96 03/25/2019 0558   HDL 48 03/25/2019 0558   CHOLHDL 2.3 03/25/2019 0558   VLDL 19 03/25/2019 0558   LDLCALC 43 03/25/2019 0558    Physical Exam:    VS:  There were no vitals taken for this visit.    Wt Readings from Last 3 Encounters:  12/02/21 187 lb (84.8 kg)  07/17/19 185 lb (83.9 kg)  06/05/19 182 lb (82.6 kg)     GEN: *** Well nourished, well developed in no acute distress HEENT: Normal NECK: No JVD; No carotid bruits LYMPHATICS: No lymphadenopathy CARDIAC: ***RRR, no murmurs, rubs, gallops RESPIRATORY:  Clear to auscultation without rales, wheezing or rhonchi  ABDOMEN: Soft, non-tender, non-distended MUSCULOSKELETAL:  No edema; No deformity  SKIN: Warm and dry NEUROLOGIC:  Alert and oriented x 3 PSYCHIATRIC:  Normal affect    Signed, Norman Herrlich, MD  01/29/2022 4:19 PM    [facility]

## 2022-01-31 NOTE — Telephone Encounter (Signed)
Pt is requesting a call back to get update on this clearance. States they saw Dr. Bing Matter on 04/20, followed by an echo on 05/08 and had an appt scheduled for Dr. Dulce Sellar for 06/20, but because of work needed to cancel this appt and rescheduled to 08/11. Pt states the first two appts should have been enough to allow for a clearance. Requesting call back to get update.

## 2022-01-31 NOTE — Telephone Encounter (Signed)
I will forward to pre op pool for review if pt has been cleared.

## 2022-01-31 NOTE — Telephone Encounter (Signed)
   Primary Cardiologist: Norman Herrlich, MD  Chart reviewed as part of pre-operative protocol coverage. Given past medical history and time since last visit, based on ACC/AHA guidelines, Patrick Curry would be at acceptable risk for the planned procedure without further cardiovascular testing.   His RCRI is a class IV risk, 11% risk of major cardiac event.  I will route this recommendation to the requesting party via Epic fax function and remove from pre-op pool.  Please call with questions.  Thomasene Ripple. Tonya Carlile NP-C    01/31/2022, 10:09 AM [facility] [address] Office [phone] Fax [phone]

## 2022-02-01 ENCOUNTER — Ambulatory Visit: Payer: BC Managed Care – PPO | Admitting: Cardiology

## 2022-02-09 NOTE — Telephone Encounter (Addendum)
Our office received a fax again from requesting office, with a note written on the fax that they have faxed over x 3 01/31/22 @ 2:00 pm 02/04/22 @ 12:43 pm 02/07/22 @ 2:10 pm  I left a message for the office today stating that we have faxed over the clearance again on 01/31/22 @ 10:09 am Edd Fabian, FNP   I am not sure if there is additional information that they are requesting such as recent labs, etc. If so this is not clearly indicated on their fax.  Hand written note also states: We have received notes-need clearance form completed.   I left message that our protocol is the information from the clearance request received by the office, is then input into a pre op format for the pre op provider and the cardiologist and pharm-d to review. The original form is no longer in use. However, we are very diligent in being sure that we have all the requested information placed into our pre op format.   I will fax these note and the ov note to requesting. In hopes that this may clearance things up for them and clearance notes were faxed over 01/31/22.Marland Kitchen  [facility] strives to care for our pt's care and needs.

## 2022-02-11 NOTE — Telephone Encounter (Signed)
I was out of the office yesterday when I came back to a vm from [place] from Dr. Saddie Benders office. I left vm that we have faxed over the clearance a number of times with ALL of the information that was requested on the clearance request from their office. See previous notes where I have explained that we have a pre op protocol for our cardiologist and pre op team, this has been in play for about 4 yrs. [facility] strives very hard to be sure that we input ALL the requested information from the requesting office into the pre op form our team uses. VM from [place] states they did get the notes that were requested, though she states they want the specific form they sent to [place] to be faxed back to them signed by MD.   I left message that we are trying very hard to help them, however ALL of the information that they are needing is outlined in Dr. Vanetta Shawl 12/02/21 ov notes. See under PLAN: line 5: Cardiovascular evaluation before lithotripsy.  I think it would be very reasonable to hold Plavix as well as Eliquis in terms of Eliquis ideally only 24 hours if possible if not 48 hours, Plavix can be held for 5 days.   Left message that all I can see at this point is that the pt's care if being delayed, despite that we have provided clearance and ALL requested information.   I will fax these notes over to requesting office again. I really am not sure what else we can do.   [facility] prides themselves are caring for our pt's.   I will mail a copy of the clearance and the MD ov notes to the pt so that he may this information as well.

## 2022-03-10 ENCOUNTER — Telehealth: Payer: Self-pay | Admitting: Cardiology

## 2022-03-10 NOTE — Telephone Encounter (Signed)
New Message:     Tresa Endo from from Dr Cindee Salt office called. She need patient last interrogation report for his pacemaker. Please fax to [phone].

## 2022-03-11 NOTE — Telephone Encounter (Signed)
Will fax requesting office back to inform that this pt does not have a pacemaker, if he does, we are not aware and do not follow.

## 2022-03-25 ENCOUNTER — Ambulatory Visit: Payer: BC Managed Care – PPO | Admitting: Cardiology

## 2022-03-25 NOTE — Progress Notes (Deleted)
Cardiology Office Note:    Date:  03/25/2022   ID:  Patrick Curry, DOB 1961-07-31, MRN [id number]  PCP:  Jim Like, NP  Cardiologist:  Norman Herrlich, MD    Referring MD: Guadalupe Maple., MD    ASSESSMENT:    No diagnosis found. PLAN:    In order of problems listed above:  ***   Next appointment: ***   Medication Adjustments/Labs and Tests Ordered: Current medicines are reviewed at length with the patient today.  Concerns regarding medicines are outlined above.  No orders of the defined types were placed in this encounter.  No orders of the defined types were placed in this encounter.   No chief complaint on file.   History of Present Illness:    Patrick Curry is a 61 y.o. male with a hx of stroke with lytic therapy and near complete recovery 2020 diabetes mellitus hypertension history of DVT coronary artery disease mild nonobstructive and paroxysmal atrial fibrillation last seen 12/02/2021. Compliance with diet, lifestyle and medications: ***  An echocardiogram performed 12/20/2021 left ventricle showed normal normal ejection fraction grade 1 diastolic dysfunction normal right ventricular size function and pulmonary artery pressure biatrial enlargement and no valvular abnormality.  1. Left ventricular ejection fraction, by estimation, is 50 to 55%. The  left ventricle has low normal function. The left ventricle has no regional  wall motion abnormalities. Left ventricular diastolic parameters are  consistent with Grade I diastolic  dysfunction (impaired relaxation).   2. Right ventricular systolic function is normal. The right ventricular  size is normal. There is normal pulmonary artery systolic pressure.   3. Left atrial size was mildly dilated.   4. Right atrial size was mildly dilated.   5. The mitral valve is normal in structure. No evidence of mitral valve  regurgitation. No evidence of mitral stenosis.   6. The aortic valve is normal in structure. Aortic valve  regurgitation is  not visualized. No aortic stenosis is present.   7. The inferior vena cava is normal in size with greater than 50%  respiratory variability, suggesting right atrial pressure of 3 mmHg.   Cerebrovascular duplex showed mild 40 to 59% bilateral ICA stenosis.  Date 12/21/2021 Past Medical History:  Diagnosis Date   Asthma 06/20/2017   Carotid stenosis 10/08/2015   Diarrhea 10/08/2015   DVT (deep venous thrombosis) (HCC)    Hemorrhagic colitis 02/12/2015   Hypercholesterolemia 06/20/2017   Hypertension associated with type 2 diabetes mellitus (HCC)    IBD (inflammatory bowel disease) 10/08/2015   Ketoacidosis 02/12/2015   Kidney stones 06/20/2017   Migraine 03/25/2014   Nonischemic cardiomyopathy (HCC) 03/26/2019   per cath 03/25/19   PAC (premature atrial contraction)    (a) 30 day monitor 05/07/19 with frequent PACs   PAF (paroxysmal atrial fibrillation) (HCC) 05/07/2019   (a) 30 day monitor 05/07/19 with 11 minute episode of PAF with average rate 104 bpm   Palpitations    Severe sepsis (HCC) 02/12/2015   Stroke (HCC) 03/25/2014   Type 2 diabetes mellitus without complication (HCC) 02/12/2015    Past Surgical History:  Procedure Laterality Date   CHOLECYSTECTOMY     COLON RESECTION     Partial, after trauma   IVC FILTER INSERTION  2011   LEFT HEART CATH AND CORONARY ANGIOGRAPHY N/A 03/25/2019   Procedure: LEFT HEART CATH AND CORONARY ANGIOGRAPHY;  Surgeon: Yvonne Kendall, MD;  Location: [facility];  Service: Cardiovascular;  Laterality: N/A;    Current Medications: No  outpatient medications have been marked as taking for the 03/25/22 encounter (Appointment) with Baldo Daub, MD.     Allergies:   Patient has no known allergies.   Social History   Socioeconomic History   Marital status: Married    Spouse name: Not on file   Number of children: Not on file   Years of education: Not on file   Highest education level: Not on file  Occupational History    Occupation: Set designer, some lifting    Employer: [employer]  Tobacco Use   Smoking status: Never   Smokeless tobacco: Never  Vaping Use   Vaping Use: Never used  Substance and Sexual Activity   Alcohol use: No   Drug use: No   Sexual activity: Not on file  Other Topics Concern   Not on file  Social History Narrative   Lives in [place] with wife and disabled son.   Social Determinants of Health   Financial Resource Strain: Not on file  Food Insecurity: Not on file  Transportation Needs: Not on file  Physical Activity: Not on file  Stress: Not on file  Social Connections: Not on file     Family History: The patient's ***family history includes Diabetes in his mother; Diabetes (age of onset: 36) in his father; Heart disease (age of onset: 50) in his mother; Hypertension in his mother. ROS:   Please see the history of present illness.    All other systems reviewed and are negative.  EKGs/Labs/Other Studies Reviewed:    The following studies were reviewed today:  EKG:  EKG ordered today and personally reviewed.  The ekg ordered today demonstrates ***  Recent Labs: No results found for requested labs within last 365 days.  Recent Lipid Panel    Component Value Date/Time   CHOL 110 03/25/2019 0558   TRIG 96 03/25/2019 0558   HDL 48 03/25/2019 0558   CHOLHDL 2.3 03/25/2019 0558   VLDL 19 03/25/2019 0558   LDLCALC 43 03/25/2019 0558    Physical Exam:    VS:  There were no vitals taken for this visit.    Wt Readings from Last 3 Encounters:  12/02/21 187 lb (84.8 kg)  07/17/19 185 lb (83.9 kg)  06/05/19 182 lb (82.6 kg)     GEN: *** Well nourished, well developed in no acute distress HEENT: Normal NECK: No JVD; No carotid bruits LYMPHATICS: No lymphadenopathy CARDIAC: ***RRR, no murmurs, rubs, gallops RESPIRATORY:  Clear to auscultation without rales, wheezing or rhonchi  ABDOMEN: Soft, non-tender, non-distended MUSCULOSKELETAL:  No  edema; No deformity  SKIN: Warm and dry NEUROLOGIC:  Alert and oriented x 3 PSYCHIATRIC:  Normal affect    Signed, Norman Herrlich, MD  03/25/2022 12:14 PM    [facility]

## 2022-05-12 DIAGNOSIS — H52223 Regular astigmatism, bilateral: Secondary | ICD-10-CM | POA: Diagnosis not present

## 2022-05-20 DIAGNOSIS — Z23 Encounter for immunization: Secondary | ICD-10-CM | POA: Diagnosis not present

## 2022-05-20 DIAGNOSIS — G629 Polyneuropathy, unspecified: Secondary | ICD-10-CM | POA: Diagnosis not present

## 2022-05-20 DIAGNOSIS — E782 Mixed hyperlipidemia: Secondary | ICD-10-CM | POA: Diagnosis not present

## 2022-05-20 DIAGNOSIS — I119 Hypertensive heart disease without heart failure: Secondary | ICD-10-CM | POA: Diagnosis not present

## 2022-05-20 DIAGNOSIS — I2510 Atherosclerotic heart disease of native coronary artery without angina pectoris: Secondary | ICD-10-CM | POA: Diagnosis not present

## 2022-05-20 DIAGNOSIS — E1140 Type 2 diabetes mellitus with diabetic neuropathy, unspecified: Secondary | ICD-10-CM | POA: Diagnosis not present

## 2022-05-20 DIAGNOSIS — E1136 Type 2 diabetes mellitus with diabetic cataract: Secondary | ICD-10-CM | POA: Diagnosis not present

## 2022-05-30 DIAGNOSIS — H268 Other specified cataract: Secondary | ICD-10-CM | POA: Diagnosis not present

## 2022-06-30 DIAGNOSIS — Z6824 Body mass index (BMI) 24.0-24.9, adult: Secondary | ICD-10-CM | POA: Diagnosis not present

## 2022-06-30 DIAGNOSIS — H6692 Otitis media, unspecified, left ear: Secondary | ICD-10-CM | POA: Diagnosis not present

## 2022-07-14 DIAGNOSIS — I69398 Other sequelae of cerebral infarction: Secondary | ICD-10-CM | POA: Diagnosis not present

## 2022-07-14 DIAGNOSIS — E785 Hyperlipidemia, unspecified: Secondary | ICD-10-CM | POA: Diagnosis not present

## 2022-07-14 DIAGNOSIS — I672 Cerebral atherosclerosis: Secondary | ICD-10-CM | POA: Diagnosis not present

## 2022-07-14 DIAGNOSIS — M519 Unspecified thoracic, thoracolumbar and lumbosacral intervertebral disc disorder: Secondary | ICD-10-CM | POA: Diagnosis not present

## 2022-07-14 DIAGNOSIS — Z7984 Long term (current) use of oral hypoglycemic drugs: Secondary | ICD-10-CM | POA: Diagnosis not present

## 2022-07-14 DIAGNOSIS — I2510 Atherosclerotic heart disease of native coronary artery without angina pectoris: Secondary | ICD-10-CM | POA: Diagnosis not present

## 2022-07-14 DIAGNOSIS — I6523 Occlusion and stenosis of bilateral carotid arteries: Secondary | ICD-10-CM | POA: Diagnosis not present

## 2022-07-14 DIAGNOSIS — R531 Weakness: Secondary | ICD-10-CM | POA: Diagnosis not present

## 2022-07-14 DIAGNOSIS — R519 Headache, unspecified: Secondary | ICD-10-CM | POA: Diagnosis not present

## 2022-07-14 DIAGNOSIS — I480 Paroxysmal atrial fibrillation: Secondary | ICD-10-CM | POA: Diagnosis not present

## 2022-07-14 DIAGNOSIS — Z7409 Other reduced mobility: Secondary | ICD-10-CM | POA: Diagnosis not present

## 2022-07-14 DIAGNOSIS — R202 Paresthesia of skin: Secondary | ICD-10-CM | POA: Diagnosis not present

## 2022-07-14 DIAGNOSIS — I714 Abdominal aortic aneurysm, without rupture, unspecified: Secondary | ICD-10-CM | POA: Diagnosis not present

## 2022-07-14 DIAGNOSIS — R0789 Other chest pain: Secondary | ICD-10-CM | POA: Diagnosis not present

## 2022-07-14 DIAGNOSIS — R200 Anesthesia of skin: Secondary | ICD-10-CM | POA: Diagnosis not present

## 2022-07-14 DIAGNOSIS — M5416 Radiculopathy, lumbar region: Secondary | ICD-10-CM | POA: Diagnosis not present

## 2022-07-14 DIAGNOSIS — E119 Type 2 diabetes mellitus without complications: Secondary | ICD-10-CM | POA: Diagnosis not present

## 2022-07-14 DIAGNOSIS — M5136 Other intervertebral disc degeneration, lumbar region: Secondary | ICD-10-CM | POA: Diagnosis not present

## 2022-07-14 DIAGNOSIS — R000 Tachycardia, unspecified: Secondary | ICD-10-CM | POA: Diagnosis not present

## 2022-07-14 DIAGNOSIS — R079 Chest pain, unspecified: Secondary | ICD-10-CM | POA: Diagnosis not present

## 2022-07-15 DIAGNOSIS — R0789 Other chest pain: Secondary | ICD-10-CM | POA: Diagnosis not present

## 2022-07-15 DIAGNOSIS — R202 Paresthesia of skin: Secondary | ICD-10-CM | POA: Diagnosis not present

## 2022-07-15 DIAGNOSIS — R079 Chest pain, unspecified: Secondary | ICD-10-CM | POA: Diagnosis not present

## 2022-07-15 DIAGNOSIS — R531 Weakness: Secondary | ICD-10-CM | POA: Diagnosis not present

## 2022-07-15 DIAGNOSIS — Z8673 Personal history of transient ischemic attack (TIA), and cerebral infarction without residual deficits: Secondary | ICD-10-CM | POA: Diagnosis not present

## 2022-07-15 DIAGNOSIS — I2510 Atherosclerotic heart disease of native coronary artery without angina pectoris: Secondary | ICD-10-CM | POA: Diagnosis not present

## 2022-07-15 DIAGNOSIS — E119 Type 2 diabetes mellitus without complications: Secondary | ICD-10-CM | POA: Diagnosis not present

## 2022-07-15 DIAGNOSIS — R200 Anesthesia of skin: Secondary | ICD-10-CM | POA: Diagnosis not present

## 2022-07-15 DIAGNOSIS — R519 Headache, unspecified: Secondary | ICD-10-CM | POA: Diagnosis not present

## 2022-07-15 DIAGNOSIS — I4891 Unspecified atrial fibrillation: Secondary | ICD-10-CM | POA: Diagnosis not present

## 2022-07-15 DIAGNOSIS — J45909 Unspecified asthma, uncomplicated: Secondary | ICD-10-CM | POA: Diagnosis not present

## 2022-07-15 DIAGNOSIS — M5126 Other intervertebral disc displacement, lumbar region: Secondary | ICD-10-CM | POA: Diagnosis not present

## 2022-07-16 DIAGNOSIS — R519 Headache, unspecified: Secondary | ICD-10-CM | POA: Diagnosis not present

## 2022-07-16 DIAGNOSIS — I517 Cardiomegaly: Secondary | ICD-10-CM | POA: Diagnosis not present

## 2022-07-16 DIAGNOSIS — I4891 Unspecified atrial fibrillation: Secondary | ICD-10-CM | POA: Diagnosis not present

## 2022-07-16 DIAGNOSIS — R079 Chest pain, unspecified: Secondary | ICD-10-CM | POA: Diagnosis not present

## 2022-07-16 DIAGNOSIS — R531 Weakness: Secondary | ICD-10-CM | POA: Diagnosis not present

## 2022-07-17 DIAGNOSIS — I491 Atrial premature depolarization: Secondary | ICD-10-CM | POA: Diagnosis not present

## 2022-09-02 DIAGNOSIS — E782 Mixed hyperlipidemia: Secondary | ICD-10-CM | POA: Diagnosis not present

## 2022-09-02 DIAGNOSIS — E1140 Type 2 diabetes mellitus with diabetic neuropathy, unspecified: Secondary | ICD-10-CM | POA: Diagnosis not present

## 2022-09-02 DIAGNOSIS — I119 Hypertensive heart disease without heart failure: Secondary | ICD-10-CM | POA: Diagnosis not present

## 2022-09-02 DIAGNOSIS — E1136 Type 2 diabetes mellitus with diabetic cataract: Secondary | ICD-10-CM | POA: Diagnosis not present

## 2022-09-02 DIAGNOSIS — I2510 Atherosclerotic heart disease of native coronary artery without angina pectoris: Secondary | ICD-10-CM | POA: Diagnosis not present

## 2022-09-02 DIAGNOSIS — Z125 Encounter for screening for malignant neoplasm of prostate: Secondary | ICD-10-CM | POA: Diagnosis not present

## 2022-09-02 DIAGNOSIS — G629 Polyneuropathy, unspecified: Secondary | ICD-10-CM | POA: Diagnosis not present

## 2022-09-29 DIAGNOSIS — Z6824 Body mass index (BMI) 24.0-24.9, adult: Secondary | ICD-10-CM | POA: Diagnosis not present

## 2022-09-29 DIAGNOSIS — I959 Hypotension, unspecified: Secondary | ICD-10-CM | POA: Diagnosis not present

## 2022-09-29 DIAGNOSIS — R42 Dizziness and giddiness: Secondary | ICD-10-CM | POA: Diagnosis not present

## 2022-09-29 DIAGNOSIS — H6992 Unspecified Eustachian tube disorder, left ear: Secondary | ICD-10-CM | POA: Diagnosis not present

## 2022-10-10 DIAGNOSIS — E119 Type 2 diabetes mellitus without complications: Secondary | ICD-10-CM | POA: Diagnosis not present

## 2022-10-10 DIAGNOSIS — H268 Other specified cataract: Secondary | ICD-10-CM | POA: Diagnosis not present

## 2022-10-10 DIAGNOSIS — Z01818 Encounter for other preprocedural examination: Secondary | ICD-10-CM | POA: Diagnosis not present

## 2022-10-10 DIAGNOSIS — H25813 Combined forms of age-related cataract, bilateral: Secondary | ICD-10-CM | POA: Diagnosis not present

## 2022-10-10 DIAGNOSIS — H25812 Combined forms of age-related cataract, left eye: Secondary | ICD-10-CM | POA: Diagnosis not present

## 2022-10-10 DIAGNOSIS — H269 Unspecified cataract: Secondary | ICD-10-CM | POA: Diagnosis not present

## 2022-11-17 DIAGNOSIS — N200 Calculus of kidney: Secondary | ICD-10-CM | POA: Diagnosis not present

## 2022-11-17 DIAGNOSIS — K449 Diaphragmatic hernia without obstruction or gangrene: Secondary | ICD-10-CM | POA: Diagnosis not present

## 2022-11-17 DIAGNOSIS — Z6824 Body mass index (BMI) 24.0-24.9, adult: Secondary | ICD-10-CM | POA: Diagnosis not present

## 2022-11-17 DIAGNOSIS — M5126 Other intervertebral disc displacement, lumbar region: Secondary | ICD-10-CM | POA: Diagnosis not present

## 2022-11-17 DIAGNOSIS — N281 Cyst of kidney, acquired: Secondary | ICD-10-CM | POA: Diagnosis not present

## 2022-11-17 DIAGNOSIS — R1031 Right lower quadrant pain: Secondary | ICD-10-CM | POA: Diagnosis not present

## 2022-11-17 DIAGNOSIS — I700 Atherosclerosis of aorta: Secondary | ICD-10-CM | POA: Diagnosis not present

## 2022-12-09 DIAGNOSIS — E1140 Type 2 diabetes mellitus with diabetic neuropathy, unspecified: Secondary | ICD-10-CM | POA: Diagnosis not present

## 2022-12-09 DIAGNOSIS — I119 Hypertensive heart disease without heart failure: Secondary | ICD-10-CM | POA: Diagnosis not present

## 2022-12-09 DIAGNOSIS — E782 Mixed hyperlipidemia: Secondary | ICD-10-CM | POA: Diagnosis not present

## 2022-12-09 DIAGNOSIS — E1136 Type 2 diabetes mellitus with diabetic cataract: Secondary | ICD-10-CM | POA: Diagnosis not present

## 2022-12-09 DIAGNOSIS — I2510 Atherosclerotic heart disease of native coronary artery without angina pectoris: Secondary | ICD-10-CM | POA: Diagnosis not present

## 2022-12-09 DIAGNOSIS — G629 Polyneuropathy, unspecified: Secondary | ICD-10-CM | POA: Diagnosis not present

## 2022-12-13 DIAGNOSIS — E113313 Type 2 diabetes mellitus with moderate nonproliferative diabetic retinopathy with macular edema, bilateral: Secondary | ICD-10-CM | POA: Diagnosis not present

## 2022-12-13 DIAGNOSIS — Z09 Encounter for follow-up examination after completed treatment for conditions other than malignant neoplasm: Secondary | ICD-10-CM | POA: Diagnosis not present

## 2022-12-27 DIAGNOSIS — H259 Unspecified age-related cataract: Secondary | ICD-10-CM | POA: Diagnosis not present

## 2022-12-27 DIAGNOSIS — H25811 Combined forms of age-related cataract, right eye: Secondary | ICD-10-CM | POA: Diagnosis not present

## 2022-12-27 DIAGNOSIS — I252 Old myocardial infarction: Secondary | ICD-10-CM | POA: Diagnosis not present

## 2022-12-27 DIAGNOSIS — E113313 Type 2 diabetes mellitus with moderate nonproliferative diabetic retinopathy with macular edema, bilateral: Secondary | ICD-10-CM | POA: Diagnosis not present

## 2022-12-27 DIAGNOSIS — Z7901 Long term (current) use of anticoagulants: Secondary | ICD-10-CM | POA: Diagnosis not present

## 2022-12-27 DIAGNOSIS — I10 Essential (primary) hypertension: Secondary | ICD-10-CM | POA: Diagnosis not present

## 2022-12-27 DIAGNOSIS — E1136 Type 2 diabetes mellitus with diabetic cataract: Secondary | ICD-10-CM | POA: Diagnosis not present

## 2022-12-27 DIAGNOSIS — Z7984 Long term (current) use of oral hypoglycemic drugs: Secondary | ICD-10-CM | POA: Diagnosis not present

## 2022-12-27 DIAGNOSIS — H26492 Other secondary cataract, left eye: Secondary | ICD-10-CM | POA: Diagnosis not present

## 2022-12-27 DIAGNOSIS — J45909 Unspecified asthma, uncomplicated: Secondary | ICD-10-CM | POA: Diagnosis not present

## 2022-12-27 DIAGNOSIS — H52223 Regular astigmatism, bilateral: Secondary | ICD-10-CM | POA: Diagnosis not present

## 2023-01-11 DIAGNOSIS — Z6825 Body mass index (BMI) 25.0-25.9, adult: Secondary | ICD-10-CM | POA: Diagnosis not present

## 2023-01-11 DIAGNOSIS — E1140 Type 2 diabetes mellitus with diabetic neuropathy, unspecified: Secondary | ICD-10-CM | POA: Diagnosis not present

## 2023-01-11 DIAGNOSIS — E162 Hypoglycemia, unspecified: Secondary | ICD-10-CM | POA: Diagnosis not present

## 2023-02-23 DIAGNOSIS — E113393 Type 2 diabetes mellitus with moderate nonproliferative diabetic retinopathy without macular edema, bilateral: Secondary | ICD-10-CM | POA: Diagnosis not present

## 2023-02-27 DIAGNOSIS — Z79899 Other long term (current) drug therapy: Secondary | ICD-10-CM | POA: Diagnosis not present

## 2023-02-27 DIAGNOSIS — I10 Essential (primary) hypertension: Secondary | ICD-10-CM | POA: Diagnosis not present

## 2023-02-27 DIAGNOSIS — Z794 Long term (current) use of insulin: Secondary | ICD-10-CM | POA: Diagnosis not present

## 2023-02-27 DIAGNOSIS — E119 Type 2 diabetes mellitus without complications: Secondary | ICD-10-CM | POA: Diagnosis not present

## 2023-02-27 DIAGNOSIS — D509 Iron deficiency anemia, unspecified: Secondary | ICD-10-CM | POA: Diagnosis not present

## 2023-02-27 DIAGNOSIS — Z8673 Personal history of transient ischemic attack (TIA), and cerebral infarction without residual deficits: Secondary | ICD-10-CM | POA: Diagnosis not present

## 2023-02-27 DIAGNOSIS — R079 Chest pain, unspecified: Secondary | ICD-10-CM | POA: Diagnosis not present

## 2023-02-27 DIAGNOSIS — Z7901 Long term (current) use of anticoagulants: Secondary | ICD-10-CM | POA: Diagnosis not present

## 2023-02-27 DIAGNOSIS — R0602 Shortness of breath: Secondary | ICD-10-CM | POA: Diagnosis not present

## 2023-02-27 DIAGNOSIS — I2510 Atherosclerotic heart disease of native coronary artery without angina pectoris: Secondary | ICD-10-CM | POA: Diagnosis not present

## 2023-02-27 DIAGNOSIS — I252 Old myocardial infarction: Secondary | ICD-10-CM | POA: Diagnosis not present

## 2023-02-28 DIAGNOSIS — R079 Chest pain, unspecified: Secondary | ICD-10-CM | POA: Diagnosis not present

## 2023-02-28 DIAGNOSIS — D509 Iron deficiency anemia, unspecified: Secondary | ICD-10-CM | POA: Diagnosis not present

## 2023-02-28 DIAGNOSIS — I10 Essential (primary) hypertension: Secondary | ICD-10-CM | POA: Diagnosis not present

## 2023-03-10 DIAGNOSIS — Z6823 Body mass index (BMI) 23.0-23.9, adult: Secondary | ICD-10-CM | POA: Diagnosis not present

## 2023-03-10 DIAGNOSIS — D509 Iron deficiency anemia, unspecified: Secondary | ICD-10-CM | POA: Diagnosis not present

## 2023-03-10 DIAGNOSIS — Z1331 Encounter for screening for depression: Secondary | ICD-10-CM | POA: Diagnosis not present

## 2023-03-10 DIAGNOSIS — R079 Chest pain, unspecified: Secondary | ICD-10-CM | POA: Diagnosis not present

## 2023-03-10 DIAGNOSIS — Z79899 Other long term (current) drug therapy: Secondary | ICD-10-CM | POA: Diagnosis not present

## 2023-03-23 NOTE — Progress Notes (Deleted)
  Cardiology Office Note:  .   Date:  03/23/2023  ID:  Patrick Curry, DOB 06-02-1961, MRN [id number] PCP: Jim Like, NP  [facility] Providers Cardiologist:  Norman Herrlich, MD { Click to update primary MD,subspecialty MD or APP then REFRESH:1}   History of Present Illness: .   Patrick Curry is a 62 y.o. male with a past medical history of carotid artery stenosis, migraines, stroke, nonobstructive CAD per [facility] in 2020, AAA, PAF, hypertension, DM 2, hyperlipidemia, history of syncope and collapse, history of DVT.  12/21/2021 carotid ultrasound-moderate bilateral stenosis 12/21/2021 echocardiogram  EF of 50 to 55%, grade 1 DD 03/25/2019 left heart cath minimal nonobstructive CAD  Most recently evaluated by Dr. Bing Matter on 12/02/2021, from a cardiac perspective he was doing well, maintaining sinus rhythm.  Repeat echocardiogram was arranged to evaluate his cardiomyopathy this was completed on 12/20/2021 revealed an EF of 50 to 55%, grade 1 DD.  Carotid ultrasound was arranged and completed on 12/20/2021 revealing moderate stenosis bilaterally.  Admitted to [facility] on 02/27/2023 with chest pain.  CTA of his chest was negative for pulmonary embolism.  No EKG changes, troponins negative, pain was relieved with nitroglycerin.  Nuclear stress test was repeated low risk.  Labs on day of discharge potassium 3.7, creatinine 0.90, sodium 134, hemoglobin 14.3, hematocrit 41.1.  Lipids? Repeat CTA   ROS: ***  Studies Reviewed: .        *** Risk Assessment/Calculations:   {Does this patient have ATRIAL FIBRILLATION?[phone]} No BP recorded.  {Refresh Note OR Click here to enter BP  :1}***       Physical Exam:   VS:  There were no vitals taken for this visit.   Wt Readings from Last 3 Encounters:  12/02/21 187 lb (84.8 kg)  07/17/19 185 lb (83.9 kg)  06/05/19 182 lb (82.6 kg)    GEN: Well nourished, well developed in no acute distress NECK: No JVD; No carotid bruits CARDIAC: ***RRR, no  murmurs, rubs, gallops RESPIRATORY:  Clear to auscultation without rales, wheezing or rhonchi  ABDOMEN: Soft, non-tender, non-distended EXTREMITIES:  No edema; No deformity   ASSESSMENT AND PLAN: .   Mild non-obstructive CAD - [facility] in 2020 revealed mild CAD Mild aneurysmal dilatation of the ascending thoracic aorta -measuring 4.2 cm in 2020 which was unchanged from 2013.  Recommendations for repeat imaging annually Carotid artery stenosis-moderate bilateral stenosis per carotid ultrasound in 2023 Hyperlipidemia-     {Are you ordering a CV Procedure (e.g. stress test, cath, DCCV, TEE, etc)?   Press F2        :[id number]}  Dispo: ***  Signed, Flossie Dibble, NP

## 2023-03-24 ENCOUNTER — Ambulatory Visit: Payer: BC Managed Care – PPO | Attending: Cardiology | Admitting: Cardiology

## 2023-03-24 DIAGNOSIS — I6523 Occlusion and stenosis of bilateral carotid arteries: Secondary | ICD-10-CM

## 2023-03-24 DIAGNOSIS — D6859 Other primary thrombophilia: Secondary | ICD-10-CM

## 2023-03-24 DIAGNOSIS — I480 Paroxysmal atrial fibrillation: Secondary | ICD-10-CM

## 2023-03-24 DIAGNOSIS — I2510 Atherosclerotic heart disease of native coronary artery without angina pectoris: Secondary | ICD-10-CM
